# Patient Record
Sex: Male | Born: 1945 | Race: White | Hispanic: No | Marital: Married | Smoking: Former smoker
Health system: Southern US, Community
[De-identification: ages and names within clinical notes are randomized; demographics above are authoritative.]

## PROBLEM LIST (undated history)

## (undated) DIAGNOSIS — I219 Acute myocardial infarction, unspecified: Secondary | ICD-10-CM

## (undated) DIAGNOSIS — M1990 Unspecified osteoarthritis, unspecified site: Secondary | ICD-10-CM

## (undated) DIAGNOSIS — E7800 Pure hypercholesterolemia, unspecified: Secondary | ICD-10-CM

## (undated) DIAGNOSIS — I10 Essential (primary) hypertension: Secondary | ICD-10-CM

## (undated) DIAGNOSIS — K7682 Hepatic encephalopathy: Secondary | ICD-10-CM

## (undated) DIAGNOSIS — I2510 Atherosclerotic heart disease of native coronary artery without angina pectoris: Secondary | ICD-10-CM

## (undated) DIAGNOSIS — K7290 Hepatic failure, unspecified without coma: Secondary | ICD-10-CM

## (undated) DIAGNOSIS — K219 Gastro-esophageal reflux disease without esophagitis: Secondary | ICD-10-CM

## (undated) DIAGNOSIS — J449 Chronic obstructive pulmonary disease, unspecified: Secondary | ICD-10-CM

## (undated) DIAGNOSIS — Z5189 Encounter for other specified aftercare: Secondary | ICD-10-CM

## (undated) DIAGNOSIS — H269 Unspecified cataract: Secondary | ICD-10-CM

## (undated) DIAGNOSIS — K7460 Unspecified cirrhosis of liver: Secondary | ICD-10-CM

## (undated) DIAGNOSIS — K2270 Barrett's esophagus without dysplasia: Secondary | ICD-10-CM

## (undated) DIAGNOSIS — I8500 Esophageal varices without bleeding: Secondary | ICD-10-CM

## (undated) DIAGNOSIS — C801 Malignant (primary) neoplasm, unspecified: Secondary | ICD-10-CM

## (undated) HISTORY — PX: OTHER SURGICAL HISTORY: SHX169

## (undated) HISTORY — DX: Barrett's esophagus without dysplasia: K22.70

## (undated) HISTORY — DX: Hepatic failure, unspecified without coma: K72.90

## (undated) HISTORY — DX: Hepatic encephalopathy: K76.82

## (undated) HISTORY — DX: Pure hypercholesterolemia, unspecified: E78.00

## (undated) HISTORY — DX: Unspecified osteoarthritis, unspecified site: M19.90

## (undated) HISTORY — DX: Unspecified cataract: H26.9

## (undated) HISTORY — PX: CATARACT EXTRACTION: SUR2

## (undated) HISTORY — DX: Atherosclerotic heart disease of native coronary artery without angina pectoris: I25.10

## (undated) HISTORY — DX: Acute myocardial infarction, unspecified: I21.9

## (undated) HISTORY — DX: Malignant (primary) neoplasm, unspecified: C80.1

## (undated) HISTORY — DX: Chronic obstructive pulmonary disease, unspecified: J44.9

## (undated) HISTORY — DX: Essential (primary) hypertension: I10

## (undated) HISTORY — DX: Encounter for other specified aftercare: Z51.89

## (undated) HISTORY — DX: Gastro-esophageal reflux disease without esophagitis: K21.9

## (undated) HISTORY — DX: Esophageal varices without bleeding: I85.00

## (undated) HISTORY — DX: Unspecified cirrhosis of liver: K74.60

---

## 1998-06-18 DIAGNOSIS — I219 Acute myocardial infarction, unspecified: Secondary | ICD-10-CM

## 1998-06-18 HISTORY — PX: OTHER SURGICAL HISTORY: SHX169

## 1998-06-18 HISTORY — DX: Acute myocardial infarction, unspecified: I21.9

## 1998-09-25 ENCOUNTER — Inpatient Hospital Stay (HOSPITAL_COMMUNITY): Admission: AD | Admit: 1998-09-25 | Discharge: 1998-09-30 | Payer: Self-pay | Admitting: Internal Medicine

## 2002-05-07 HISTORY — PX: COLONOSCOPY: SHX174

## 2012-04-01 DIAGNOSIS — K7030 Alcoholic cirrhosis of liver without ascites: Secondary | ICD-10-CM | POA: Insufficient documentation

## 2012-04-01 DIAGNOSIS — I8501 Esophageal varices with bleeding: Secondary | ICD-10-CM | POA: Insufficient documentation

## 2012-04-01 DIAGNOSIS — K766 Portal hypertension: Secondary | ICD-10-CM | POA: Insufficient documentation

## 2012-04-01 DIAGNOSIS — E663 Overweight: Secondary | ICD-10-CM | POA: Insufficient documentation

## 2013-02-26 HISTORY — PX: ESOPHAGOGASTRODUODENOSCOPY: SHX1529

## 2017-04-22 DIAGNOSIS — Z0000 Encounter for general adult medical examination without abnormal findings: Secondary | ICD-10-CM | POA: Diagnosis not present

## 2017-04-22 DIAGNOSIS — E119 Type 2 diabetes mellitus without complications: Secondary | ICD-10-CM | POA: Diagnosis not present

## 2017-04-22 DIAGNOSIS — Z23 Encounter for immunization: Secondary | ICD-10-CM | POA: Diagnosis not present

## 2017-04-22 DIAGNOSIS — I10 Essential (primary) hypertension: Secondary | ICD-10-CM | POA: Diagnosis not present

## 2017-04-22 DIAGNOSIS — Z79899 Other long term (current) drug therapy: Secondary | ICD-10-CM | POA: Diagnosis not present

## 2017-04-22 DIAGNOSIS — G4700 Insomnia, unspecified: Secondary | ICD-10-CM | POA: Diagnosis not present

## 2017-05-01 DIAGNOSIS — M5416 Radiculopathy, lumbar region: Secondary | ICD-10-CM | POA: Diagnosis not present

## 2017-05-15 DIAGNOSIS — M5416 Radiculopathy, lumbar region: Secondary | ICD-10-CM | POA: Diagnosis not present

## 2017-05-15 DIAGNOSIS — M5136 Other intervertebral disc degeneration, lumbar region: Secondary | ICD-10-CM | POA: Diagnosis not present

## 2017-05-15 DIAGNOSIS — M48061 Spinal stenosis, lumbar region without neurogenic claudication: Secondary | ICD-10-CM | POA: Diagnosis not present

## 2017-06-24 DIAGNOSIS — Z9181 History of falling: Secondary | ICD-10-CM | POA: Diagnosis not present

## 2017-06-24 DIAGNOSIS — E785 Hyperlipidemia, unspecified: Secondary | ICD-10-CM | POA: Diagnosis not present

## 2017-06-24 DIAGNOSIS — Z125 Encounter for screening for malignant neoplasm of prostate: Secondary | ICD-10-CM | POA: Diagnosis not present

## 2017-06-24 DIAGNOSIS — Z0000 Encounter for general adult medical examination without abnormal findings: Secondary | ICD-10-CM | POA: Diagnosis not present

## 2017-06-24 DIAGNOSIS — Z1331 Encounter for screening for depression: Secondary | ICD-10-CM | POA: Diagnosis not present

## 2017-07-03 DIAGNOSIS — M5416 Radiculopathy, lumbar region: Secondary | ICD-10-CM | POA: Diagnosis not present

## 2017-07-17 DIAGNOSIS — G4700 Insomnia, unspecified: Secondary | ICD-10-CM | POA: Diagnosis not present

## 2017-07-25 DIAGNOSIS — Z125 Encounter for screening for malignant neoplasm of prostate: Secondary | ICD-10-CM | POA: Diagnosis not present

## 2017-07-25 DIAGNOSIS — G4700 Insomnia, unspecified: Secondary | ICD-10-CM | POA: Diagnosis not present

## 2017-07-25 DIAGNOSIS — Z139 Encounter for screening, unspecified: Secondary | ICD-10-CM | POA: Diagnosis not present

## 2017-08-07 DIAGNOSIS — M5136 Other intervertebral disc degeneration, lumbar region: Secondary | ICD-10-CM | POA: Diagnosis not present

## 2017-08-07 DIAGNOSIS — G4700 Insomnia, unspecified: Secondary | ICD-10-CM | POA: Diagnosis not present

## 2017-08-14 DIAGNOSIS — G4700 Insomnia, unspecified: Secondary | ICD-10-CM | POA: Diagnosis not present

## 2017-08-14 DIAGNOSIS — K7460 Unspecified cirrhosis of liver: Secondary | ICD-10-CM | POA: Diagnosis not present

## 2017-08-14 DIAGNOSIS — I2510 Atherosclerotic heart disease of native coronary artery without angina pectoris: Secondary | ICD-10-CM | POA: Diagnosis not present

## 2017-08-14 DIAGNOSIS — J449 Chronic obstructive pulmonary disease, unspecified: Secondary | ICD-10-CM | POA: Diagnosis not present

## 2017-08-29 ENCOUNTER — Encounter: Payer: Self-pay | Admitting: Gastroenterology

## 2017-08-30 DIAGNOSIS — I700 Atherosclerosis of aorta: Secondary | ICD-10-CM | POA: Diagnosis not present

## 2017-08-30 DIAGNOSIS — K7460 Unspecified cirrhosis of liver: Secondary | ICD-10-CM | POA: Diagnosis not present

## 2017-08-30 DIAGNOSIS — N135 Crossing vessel and stricture of ureter without hydronephrosis: Secondary | ICD-10-CM | POA: Diagnosis not present

## 2017-08-30 DIAGNOSIS — K8020 Calculus of gallbladder without cholecystitis without obstruction: Secondary | ICD-10-CM | POA: Diagnosis not present

## 2017-08-30 DIAGNOSIS — K7469 Other cirrhosis of liver: Secondary | ICD-10-CM | POA: Diagnosis not present

## 2017-08-30 DIAGNOSIS — K766 Portal hypertension: Secondary | ICD-10-CM | POA: Diagnosis not present

## 2017-09-03 DIAGNOSIS — G4700 Insomnia, unspecified: Secondary | ICD-10-CM | POA: Diagnosis not present

## 2017-09-03 DIAGNOSIS — G8929 Other chronic pain: Secondary | ICD-10-CM | POA: Diagnosis not present

## 2017-09-03 DIAGNOSIS — J209 Acute bronchitis, unspecified: Secondary | ICD-10-CM | POA: Diagnosis not present

## 2017-10-11 ENCOUNTER — Other Ambulatory Visit (INDEPENDENT_AMBULATORY_CARE_PROVIDER_SITE_OTHER): Payer: Medicare Other

## 2017-10-11 ENCOUNTER — Encounter: Payer: Self-pay | Admitting: Gastroenterology

## 2017-10-11 ENCOUNTER — Ambulatory Visit (INDEPENDENT_AMBULATORY_CARE_PROVIDER_SITE_OTHER): Payer: Medicare Other | Admitting: Gastroenterology

## 2017-10-11 VITALS — BP 118/62 | HR 60 | Ht 64.0 in | Wt 160.0 lb

## 2017-10-11 DIAGNOSIS — K7290 Hepatic failure, unspecified without coma: Secondary | ICD-10-CM | POA: Diagnosis not present

## 2017-10-11 DIAGNOSIS — I701 Atherosclerosis of renal artery: Secondary | ICD-10-CM

## 2017-10-11 DIAGNOSIS — K7031 Alcoholic cirrhosis of liver with ascites: Secondary | ICD-10-CM

## 2017-10-11 DIAGNOSIS — M545 Low back pain, unspecified: Secondary | ICD-10-CM | POA: Insufficient documentation

## 2017-10-11 DIAGNOSIS — K7682 Hepatic encephalopathy: Secondary | ICD-10-CM

## 2017-10-11 LAB — COMPREHENSIVE METABOLIC PANEL
ALBUMIN: 3.7 g/dL (ref 3.5–5.2)
ALK PHOS: 62 U/L (ref 39–117)
ALT: 21 U/L (ref 0–53)
AST: 26 U/L (ref 0–37)
BILIRUBIN TOTAL: 0.7 mg/dL (ref 0.2–1.2)
BUN: 18 mg/dL (ref 6–23)
CALCIUM: 9.2 mg/dL (ref 8.4–10.5)
CO2: 28 mEq/L (ref 19–32)
CREATININE: 0.99 mg/dL (ref 0.40–1.50)
Chloride: 102 mEq/L (ref 96–112)
GFR: 78.94 mL/min (ref >60.00)
Glucose, Bld: 107 mg/dL — ABNORMAL HIGH (ref 70–99)
Potassium: 4.2 mEq/L (ref 3.5–5.1)
Sodium: 137 mEq/L (ref 135–145)
TOTAL PROTEIN: 6.6 g/dL (ref 6.0–8.3)

## 2017-10-11 LAB — CBC WITH DIFFERENTIAL/PLATELET
BASOS ABS: 0 10*3/uL (ref 0.0–0.1)
Basophils Relative: 0.6 % (ref 0.0–3.0)
EOS ABS: 0 10*3/uL (ref 0.0–0.7)
Eosinophils Relative: 1.9 % (ref 0.0–5.0)
HEMATOCRIT: 37.6 % — AB (ref 39.0–52.0)
HEMOGLOBIN: 12.6 g/dL — AB (ref 13.0–17.0)
LYMPHS PCT: 45.8 % (ref 12.0–46.0)
Lymphs Abs: 0.9 10*3/uL (ref 0.7–4.0)
MCHC: 33.5 g/dL (ref 30.0–36.0)
MCV: 97.9 fl (ref 78.0–100.0)
MONO ABS: 0.2 10*3/uL (ref 0.1–1.0)
Monocytes Relative: 11.9 % (ref 3.0–12.0)
Neutro Abs: 0.8 10*3/uL — ABNORMAL LOW (ref 1.4–7.7)
Neutrophils Relative %: 39.8 % — ABNORMAL LOW (ref 43.0–77.0)
Platelets: 97 10*3/uL — ABNORMAL LOW (ref 150.0–400.0)
RBC: 3.84 Mil/uL — AB (ref 4.22–5.81)
RDW: 15.7 % — ABNORMAL HIGH (ref 11.5–15.5)

## 2017-10-11 LAB — PROTIME-INR
INR: 1.2 ratio — ABNORMAL HIGH (ref 0.8–1.0)
Prothrombin Time: 13.5 s — ABNORMAL HIGH (ref 9.6–13.1)

## 2017-10-11 LAB — AMMONIA: Ammonia: 49 umol/L — ABNORMAL HIGH (ref 11–35)

## 2017-10-11 MED ORDER — RIFAXIMIN 550 MG PO TABS: 550.0000 mg | ORAL_TABLET | Freq: Two times a day (BID) | ORAL | 3 refills | Status: DC

## 2017-10-11 MED ORDER — CARVEDILOL 12.5 MG PO TABS: 12.5000 mg | ORAL_TABLET | Freq: Two times a day (BID) | ORAL | 3 refills | Status: DC

## 2017-10-11 MED ORDER — POTASSIUM CHLORIDE ER 20 MEQ PO TBCR: 20.0000 meq | EXTENDED_RELEASE_TABLET | Freq: Every day | ORAL | 3 refills | Status: DC

## 2017-10-11 MED ORDER — FUROSEMIDE 40 MG PO TABS: 40.0000 mg | ORAL_TABLET | Freq: Every day | ORAL | 3 refills | Status: DC

## 2017-10-11 NOTE — Patient Instructions (Addendum)
If you are age 72 or older, your body mass index should be between 23-30. Your Body mass index is 27.46 kg/m. If this is out of the aforementioned range listed, please consider follow up with your Primary Care Provider.  If you are age 54 or younger, your body mass index should be between 19-25. Your Body mass index is 27.46 kg/m. If this is out of the aformentioned range listed, please consider follow up with your Primary Care Provider.   We have sent the following medications to your pharmacy for you to pick up at your convenience: Josie Saunders twice daily Carvedilol twice daily Lasix once daily.  K Dur once daily.  Please stop at the lab before you leave the office today.   Thank you,  Dr. Jackquline Denmark

## 2017-10-11 NOTE — Progress Notes (Signed)
IMPRESSION and PLAN:    #1. Alcoholic Liver Cirrhosis with portal hypertension (off ETOH since april 2006). Liver Bx 01/2005-grade 3, stage IV cirrhosis.  Complicated by portal hypertension (splenomegaly, thrombocytopenia, esophageal varices with history of bleeding status post EVL, ascites and hepatic encephalopathy). MELD-Na: 11 (12/2016)  -Continue Lasix 40mg  po qd. -KDur 20 mEq p.o. once a day to continue. - Add Rifaxamin 500mg  po bid (samples given and prescription called in). Had to stop previously due to cost.  Hopefully, it will be covered.  He wants to stop lactulose. I have urged him to continue at a much lower dose.  -Please obtain previous records - CT from [facility] March 2019.  -Check CBC, CMP, ammonia, PT/INR today. -Continue salt and fluid restricted diet. -FU in 6 months.  #2.  Esophageal varices status post EVL 2007, Most recent EGD 01/31/2012-small esophageal varices, too small for EVL, fundal varices, small hiatal hernia, mild portal hypertensive gastropathy.  -Continue Carvedilol 12.5mg  po bid. -I have offered patient EGD today.  He would like to hold off. -FU in 6 months, earlier in case of any problems.  #3.  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 Loletha Grayer CT was done for [facility] screening).  Patient does not want to pursue any further work-up/treatment.    #4. [place] screening. Neg CT 01/07/2017, AFP 2.0 (12/2016).  -Check AFP today. -Obtain results of the CT scan performed at [facility].  HPI:    Chief Complaint:   Patient is a 72 year old very pleasant white male with advanced alcoholic liver cirrhosis with portal hypertension being followed by Dr. Loletha Grayer at [facility]. Next appt with Dr Loletha Grayer in July 2019.  Had CT scan done in [facility] recently at request of Dr. Loletha Grayer.  I do not have any records currently.  Comes to the GI clinic for follow-up visit and prescription refill.  Has no  complaints.  No nausea, vomiting, heartburn, regurgitation, odynophagia or dysphagia.  No significant diarrhea or constipation.  There is no melena or hematochezia. No unintentional weight loss.  No abdominal pain.    Review of systems:       Past Medical History:  Diagnosis Date  . Barrett's esophagus   . CAD (coronary artery disease)   . Cirrhosis ([place])   . COPD (chronic obstructive pulmonary disease) ([place])   . Esophageal varices ([place])   . Hepatic encephalopathy ([place])   . Hypercholesteremia   . Hypertension     Current Outpatient Medications  Medication Sig Dispense Refill  . enalapril (VASOTEC) 20 MG tablet Take by mouth.    . esomeprazole (NEXIUM) 40 MG capsule daily.    . furosemide (LASIX) 40 MG tablet Take by mouth.    . lactulose (CHRONULAC) 10 GM/15ML solution Take by mouth.    . traMADol (ULTRAM) 50 MG tablet Take by mouth.    . carvedilol (COREG) 12.5 MG tablet Take by mouth.    . Multiple Vitamin (MULTIVITAMIN) capsule Take by mouth.    . temazepam (RESTORIL) 30 MG capsule      No current facility-administered medications for this visit.     Family History  Problem Relation Age of Onset  . Colon cancer Neg Hx     Social History   Tobacco Use  . Smoking status: Former Research scientist (life sciences)  . Smokeless tobacco: Former Network engineer Use Topics  . Alcohol use: Not Currently  . Drug use: Not on file    Allergies  Allergen Reactions  . Codeine      Review of Systems: All systems reviewed and negative except where noted in HPI.    Physical Exam:     BP 118/62   Pulse 60   Ht 5\' 4"  (1.626 m)   Wt 160 lb (72.6 kg)   BMI 27.46 kg/m  @WEIGHTLAST3 @ GENERAL:  Alert, oriented, cooperative, not in acute distress. PSYCH: :Pleasant, normal mood and affect. HEENT:  conjunctiva pink, mucous membranes moist, neck supple without masses. No jaundice. CARDIAC:  S1 S2 normal. No murmers. PULM: Normal respiratory effort, lungs CTA bilaterally, no wheezing. ABDOMEN:  Inspection: No visible peristalsis, no abnormal pulsations, skin normal.  Palpation/percussion: Soft, nontender, nondistended, no rigidity, no abnormal dullness to percussion, no hepatosplenomegaly and no palpable abdominal masses.  Auscultation: Normal bowel sounds, no abdominal bruits. Rectal exam: Deferred SKIN:  turgor, no lesions seen. Musculoskeletal:  Normal muscle tone, normal strength. NEURO: Alert and oriented x 3, no focal neurologic deficits.     Britny Riel,MD 10/11/2017, 11:28 AM   CC Dr Delena Bali

## 2017-10-14 ENCOUNTER — Other Ambulatory Visit: Payer: Self-pay

## 2017-10-14 ENCOUNTER — Telehealth: Payer: Self-pay

## 2017-10-14 DIAGNOSIS — K7031 Alcoholic cirrhosis of liver with ascites: Secondary | ICD-10-CM

## 2017-10-14 NOTE — Addendum Note (Signed)
Addended by: Karena Addison on: 10/14/2017 08:49 AM   Modules accepted: Orders

## 2017-10-14 NOTE — Telephone Encounter (Signed)
Should be fine. Please send a note to PCP to get it done - repeat ammonia and alpha-fetoprotein

## 2017-10-14 NOTE — Telephone Encounter (Signed)
Lab was not able to add on the AFP, not enough blood.  He is seeing his PCP in 2 weeks and wants to have the ammonia level and AFP done at that visit.  Is that timeframe OK?  Please advise.  Thank you.

## 2017-10-15 ENCOUNTER — Telehealth: Payer: Self-pay | Admitting: Gastroenterology

## 2017-10-15 NOTE — Telephone Encounter (Signed)
He is complaining that the Xifaxan makes him "dizzy" and is asking to switch to a different medication.  Please advise.  Thank you.

## 2017-10-15 NOTE — Telephone Encounter (Signed)
His ammonia level was elevated.  Please tell him to continue taking lactulose And titrate It to 2-3 bowel movements per day. No other alternative to rifaximin.  He should get better if he continues to take it.

## 2017-10-16 ENCOUNTER — Telehealth: Payer: Self-pay

## 2017-10-16 ENCOUNTER — Other Ambulatory Visit: Payer: Self-pay

## 2017-10-16 DIAGNOSIS — K7031 Alcoholic cirrhosis of liver with ascites: Secondary | ICD-10-CM

## 2017-10-16 DIAGNOSIS — K7290 Hepatic failure, unspecified without coma: Secondary | ICD-10-CM

## 2017-10-16 DIAGNOSIS — K7682 Hepatic encephalopathy: Secondary | ICD-10-CM

## 2017-10-16 MED ORDER — LACTULOSE 10 GM/15ML PO SOLN: ORAL | 1 refills | Status: DC

## 2017-10-16 NOTE — Telephone Encounter (Signed)
He reports that he is almost out of the Lactulose so I did send a refill to his pharmacy.

## 2017-10-16 NOTE — Telephone Encounter (Signed)
lmtcb 5/1   PKW

## 2017-10-21 ENCOUNTER — Telehealth: Payer: Self-pay | Admitting: Gastroenterology

## 2017-10-29 DIAGNOSIS — G4700 Insomnia, unspecified: Secondary | ICD-10-CM | POA: Diagnosis not present

## 2017-10-29 DIAGNOSIS — I10 Essential (primary) hypertension: Secondary | ICD-10-CM | POA: Diagnosis not present

## 2017-10-29 DIAGNOSIS — Z79899 Other long term (current) drug therapy: Secondary | ICD-10-CM | POA: Diagnosis not present

## 2017-10-29 DIAGNOSIS — I2510 Atherosclerotic heart disease of native coronary artery without angina pectoris: Secondary | ICD-10-CM | POA: Diagnosis not present

## 2017-12-20 DIAGNOSIS — H04123 Dry eye syndrome of bilateral lacrimal glands: Secondary | ICD-10-CM | POA: Diagnosis not present

## 2017-12-20 DIAGNOSIS — H11821 Conjunctivochalasis, right eye: Secondary | ICD-10-CM | POA: Diagnosis not present

## 2017-12-23 ENCOUNTER — Telehealth: Payer: Self-pay | Admitting: Gastroenterology

## 2017-12-23 DIAGNOSIS — K7031 Alcoholic cirrhosis of liver with ascites: Secondary | ICD-10-CM

## 2017-12-23 DIAGNOSIS — K7290 Hepatic failure, unspecified without coma: Secondary | ICD-10-CM

## 2017-12-23 DIAGNOSIS — K7682 Hepatic encephalopathy: Secondary | ICD-10-CM

## 2017-12-24 NOTE — Telephone Encounter (Signed)
Pt returned your call and would like another call back. °

## 2017-12-24 NOTE — Telephone Encounter (Signed)
Left message for patient to return phone call.  

## 2017-12-25 MED ORDER — LACTULOSE 10 GM/15ML PO SOLN: ORAL | 5 refills | Status: DC

## 2017-12-25 NOTE — Telephone Encounter (Signed)
Patient needed refill on Lactulose. Sent refills to patients pharmacy.

## 2017-12-31 DIAGNOSIS — E119 Type 2 diabetes mellitus without complications: Secondary | ICD-10-CM | POA: Diagnosis not present

## 2017-12-31 DIAGNOSIS — Z79899 Other long term (current) drug therapy: Secondary | ICD-10-CM | POA: Diagnosis not present

## 2017-12-31 DIAGNOSIS — I10 Essential (primary) hypertension: Secondary | ICD-10-CM | POA: Diagnosis not present

## 2017-12-31 DIAGNOSIS — G4700 Insomnia, unspecified: Secondary | ICD-10-CM | POA: Diagnosis not present

## 2017-12-31 DIAGNOSIS — Z1339 Encounter for screening examination for other mental health and behavioral disorders: Secondary | ICD-10-CM | POA: Diagnosis not present

## 2018-01-17 DIAGNOSIS — H2513 Age-related nuclear cataract, bilateral: Secondary | ICD-10-CM | POA: Diagnosis not present

## 2018-01-17 DIAGNOSIS — H11821 Conjunctivochalasis, right eye: Secondary | ICD-10-CM | POA: Diagnosis not present

## 2018-01-17 DIAGNOSIS — H524 Presbyopia: Secondary | ICD-10-CM | POA: Diagnosis not present

## 2018-01-17 DIAGNOSIS — H52223 Regular astigmatism, bilateral: Secondary | ICD-10-CM | POA: Diagnosis not present

## 2018-01-17 DIAGNOSIS — H04123 Dry eye syndrome of bilateral lacrimal glands: Secondary | ICD-10-CM | POA: Diagnosis not present

## 2018-02-11 DIAGNOSIS — H6120 Impacted cerumen, unspecified ear: Secondary | ICD-10-CM | POA: Diagnosis not present

## 2018-02-20 ENCOUNTER — Telehealth: Payer: Self-pay | Admitting: Gastroenterology

## 2018-02-20 DIAGNOSIS — K7290 Hepatic failure, unspecified without coma: Secondary | ICD-10-CM

## 2018-02-20 DIAGNOSIS — K7031 Alcoholic cirrhosis of liver with ascites: Secondary | ICD-10-CM

## 2018-02-20 DIAGNOSIS — K7682 Hepatic encephalopathy: Secondary | ICD-10-CM

## 2018-02-20 MED ORDER — CARVEDILOL 12.5 MG PO TABS: 12.5000 mg | ORAL_TABLET | Freq: Two times a day (BID) | ORAL | 3 refills | Status: DC

## 2018-02-20 MED ORDER — LACTULOSE 10 GM/15ML PO SOLN: ORAL | 5 refills | Status: DC

## 2018-02-20 NOTE — Telephone Encounter (Signed)
Sent refills to patients pharmacy.  

## 2018-03-21 ENCOUNTER — Ambulatory Visit: Payer: Medicare Other | Admitting: Gastroenterology

## 2018-04-02 DIAGNOSIS — E119 Type 2 diabetes mellitus without complications: Secondary | ICD-10-CM | POA: Diagnosis not present

## 2018-04-02 DIAGNOSIS — G4700 Insomnia, unspecified: Secondary | ICD-10-CM | POA: Diagnosis not present

## 2018-04-02 DIAGNOSIS — E785 Hyperlipidemia, unspecified: Secondary | ICD-10-CM | POA: Diagnosis not present

## 2018-04-02 DIAGNOSIS — Z23 Encounter for immunization: Secondary | ICD-10-CM | POA: Diagnosis not present

## 2018-04-02 DIAGNOSIS — Z79899 Other long term (current) drug therapy: Secondary | ICD-10-CM | POA: Diagnosis not present

## 2018-05-29 ENCOUNTER — Ambulatory Visit: Payer: Medicare Other | Admitting: Gastroenterology

## 2018-06-05 ENCOUNTER — Ambulatory Visit: Payer: Medicare Other | Admitting: Gastroenterology

## 2018-06-05 ENCOUNTER — Encounter: Payer: Self-pay | Admitting: Gastroenterology

## 2018-06-05 ENCOUNTER — Other Ambulatory Visit (INDEPENDENT_AMBULATORY_CARE_PROVIDER_SITE_OTHER): Payer: Medicare Other

## 2018-06-05 DIAGNOSIS — K7031 Alcoholic cirrhosis of liver with ascites: Secondary | ICD-10-CM | POA: Diagnosis not present

## 2018-06-05 DIAGNOSIS — K7682 Hepatic encephalopathy: Secondary | ICD-10-CM

## 2018-06-05 DIAGNOSIS — K7290 Hepatic failure, unspecified without coma: Secondary | ICD-10-CM

## 2018-06-05 LAB — CBC WITH DIFFERENTIAL/PLATELET
Basophils Absolute: 0 10*3/uL (ref 0.0–0.1)
Basophils Relative: 0.7 % (ref 0.0–3.0)
Eosinophils Absolute: 0.1 10*3/uL (ref 0.0–0.7)
Eosinophils Relative: 2.7 % (ref 0.0–5.0)
HCT: 41.1 % (ref 39.0–52.0)
HEMOGLOBIN: 13.8 g/dL (ref 13.0–17.0)
Lymphocytes Relative: 46.3 % — ABNORMAL HIGH (ref 12.0–46.0)
Lymphs Abs: 1.1 10*3/uL (ref 0.7–4.0)
MCHC: 33.5 g/dL (ref 30.0–36.0)
MCV: 96.6 fl (ref 78.0–100.0)
Monocytes Absolute: 0.3 10*3/uL (ref 0.1–1.0)
Monocytes Relative: 12.7 % — ABNORMAL HIGH (ref 3.0–12.0)
Neutro Abs: 0.9 10*3/uL — ABNORMAL LOW (ref 1.4–7.7)
Neutrophils Relative %: 37.6 % — ABNORMAL LOW (ref 43.0–77.0)
Platelets: 104 10*3/uL — ABNORMAL LOW (ref 150.0–400.0)
RBC: 4.26 Mil/uL (ref 4.22–5.81)
RDW: 15.8 % — ABNORMAL HIGH (ref 11.5–15.5)
WBC: 2.5 10*3/uL — ABNORMAL LOW (ref 4.0–10.5)

## 2018-06-05 LAB — COMPREHENSIVE METABOLIC PANEL
ALT: 21 U/L (ref 0–53)
AST: 27 U/L (ref 0–37)
Albumin: 4.3 g/dL (ref 3.5–5.2)
Alkaline Phosphatase: 57 U/L (ref 39–117)
BUN: 20 mg/dL (ref 6–23)
CALCIUM: 10.1 mg/dL (ref 8.4–10.5)
CO2: 27 mEq/L (ref 19–32)
Chloride: 100 mEq/L (ref 96–112)
Creatinine, Ser: 1.14 mg/dL (ref 0.40–1.50)
GFR: 66.96 mL/min (ref >60.00)
Glucose, Bld: 112 mg/dL — ABNORMAL HIGH (ref 70–99)
Potassium: 4.3 mEq/L (ref 3.5–5.1)
Sodium: 137 mEq/L (ref 135–145)
Total Bilirubin: 0.9 mg/dL (ref 0.2–1.2)
Total Protein: 7.2 g/dL (ref 6.0–8.3)

## 2018-06-05 LAB — PROTIME-INR
INR: 1.1 ratio — ABNORMAL HIGH (ref 0.8–1.0)
Prothrombin Time: 12.9 s (ref 9.6–13.1)

## 2018-06-05 LAB — AMMONIA: Ammonia: 43 umol/L — ABNORMAL HIGH (ref 11–35)

## 2018-06-05 MED ORDER — FUROSEMIDE 40 MG PO TABS: 40.0000 mg | ORAL_TABLET | Freq: Every day | ORAL | 11 refills | Status: DC

## 2018-06-05 MED ORDER — LACTULOSE 10 GM/15ML PO SOLN: ORAL | 11 refills | Status: DC

## 2018-06-05 MED ORDER — RIFAXIMIN 550 MG PO TABS: 550.0000 mg | ORAL_TABLET | Freq: Two times a day (BID) | ORAL | 11 refills | Status: DC

## 2018-06-05 MED ORDER — POTASSIUM CHLORIDE ER 20 MEQ PO TBCR: 20.0000 meq | EXTENDED_RELEASE_TABLET | Freq: Every day | ORAL | 11 refills | Status: DC

## 2018-06-05 NOTE — Progress Notes (Signed)
IMPRESSION and PLAN:    #1. Alcoholic Liver Cirrhosis with portal hypertension (off ETOH since april 2006). Liver Bx 01/2005-grade 3, stage IV cirrhosis.  Complicated by portal hypertension (splenomegaly, thrombocytopenia/pancytopenia, esophageal varices with history of bleeding status post EVL, ascites and hepatic encephalopathy). MELD-Na: 11 (12/2016)  - Continue Lasix 40mg  po qd. - KDur 20 mEq p.o. once a day to continue. - Rifaxamin 500mg  po bid. Had to stop previously due to cost.  Hopefully, it will be covered.  - Resume lactulose 30 cc bid. Aim is to have 2-3 loose BMs a day. - Please obtain previous records - CT from [facility] March 2019.  - Check CBC, CMP, ammonia, PT/INR today. - Continue salt and fluid restricted diet. - FU in 6 months.  #2.  Esophageal varices status post EVL 2007, Most recent EGD 02/2013-small esophageal varices, too small for EVL, small fundal varices, small hiatal hernia, mild portal hypertensive gastropathy, short segment Barrett's esophagus..  -Continue Carvedilol 12.5mg  po bid. -Offered EGD but he refuses again. Has transportation issues. -Continue omeprazole 20 mg p.o. once a day. -FU in 6 months, earlier in case of any problems.  #3.  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 Loletha Grayer CT was done for [facility] screening).  Patient does not want to pursue any further work-up/treatment.    #4. [place] screening. Neg CT 01/07/2017, AFP 2.0 (12/2016).  -Check AFP today. -Obtain results of the CT scan performed at [place] to get. -Awaiting US liver with doppler at [facility] as per Dr Glade Stanford orders.  She recommends ultrasound twice a year.  HPI:    Chief Complaint:   Patient is a 72 year old very pleasant white male with advanced alcoholic liver cirrhosis with portal hypertension being followed by Dr. Loletha Grayer at [facility]. Saw Dr Loletha Grayer 04/21/2018.  No complaints.  Has not been taking  lactulose as prescribed.  He has also stopped taking rifaximin on his own.  Refuses EGD due to transportation problems.  No nausea, vomiting, heartburn, regurgitation, odynophagia or dysphagia.  No significant diarrhea or constipation.  There is no melena or hematochezia. No unintentional weight loss.  No abdominal pain.    Review of systems:       Past Medical History:  Diagnosis Date  . Barrett's esophagus   . CAD (coronary artery disease)   . Cirrhosis ([place])   . COPD (chronic obstructive pulmonary disease) ([place])   . Esophageal varices ([place])   . Heart attack ([place]) 2000  . Hepatic encephalopathy ([place])   . Hypercholesteremia   . Hypertension     Current Outpatient Medications  Medication Sig Dispense Refill  . carvedilol (COREG) 12.5 MG tablet Take 1 tablet (12.5 mg total) by mouth 2 (two) times daily. 60 tablet 3  . enalapril (VASOTEC) 20 MG tablet Take by mouth.    . furosemide (LASIX) 40 MG tablet Take 1 tablet (40 mg total) by mouth daily. 30 tablet 3  . lactulose (CHRONULAC) 10 GM/15ML solution Take 15 ml by mouth every other and PRN 240 mL 5  . Multiple Vitamin (MULTIVITAMIN) capsule Take by mouth.    . Omeprazole (PRILOSEC PO) Take 1 tablet by mouth daily as needed.    . Potassium Chloride ER 20 MEQ TBCR Take 20 mEq by mouth daily. 30 tablet 3  . temazepam (RESTORIL) 30 MG capsule     . traMADol (ULTRAM) 50 MG tablet Take by mouth.     No current facility-administered  medications for this visit.     Family History  Problem Relation Age of Onset  . Cancer Brother        mouth per patient   . Colon cancer Neg Hx   . Esophageal cancer Neg Hx     Social History   Tobacco Use  . Smoking status: Former Research scientist (life sciences)  . Smokeless tobacco: Former Systems developer  . Tobacco comment: quit smoking in 2000   Substance Use Topics  . Alcohol use: Not Currently  . Drug use: Not Currently    Allergies  Allergen Reactions  . Codeine      Review of Systems: All systems reviewed and  negative except where noted in HPI.    Physical Exam:     BP 108/62   Pulse 62   Ht 5\' 4"  (1.626 m)   Wt 148 lb (67.1 kg)   BMI 25.40 kg/m   GENERAL:  Alert, oriented, cooperative, not in acute distress. PSYCH: :Pleasant, normal mood and affect. HEENT:  conjunctiva pink, mucous membranes moist, neck supple without masses. No jaundice. CARDIAC:  S1 S2 normal. No murmers. PULM: Normal respiratory effort, lungs CTA bilaterally, no wheezing. ABDOMEN: Inspection: No visible peristalsis, no abnormal pulsations, skin normal.  Palpation/percussion: Soft, nontender, nondistended, no rigidity, no abnormal dullness to percussion, no hepatosplenomegaly and no palpable abdominal masses.  Auscultation: Normal bowel sounds, no abdominal bruits. Rectal exam: Deferred SKIN:  turgor, no lesions seen. Musculoskeletal:  Normal muscle tone, normal strength. NEURO: Alert and oriented x 3, no focal neurologic deficits. CBC Latest Ref Rng & Units 10/11/2017  WBC 4.0 - 10.5 K/uL 2.0 Repeated and verified X2.(L)  Hemoglobin 13.0 - 17.0 g/dL 12.6(L)  Hematocrit 39.0 - 52.0 % 37.6(L)  Platelets 150.0 - 400.0 K/uL 97.0(L)   CMP Latest Ref Rng & Units 10/11/2017  Glucose 70 - 99 mg/dL 107(H)  BUN 6 - 23 mg/dL 18  Creatinine 0.40 - 1.50 mg/dL 0.99  Sodium 135 - 145 mEq/L 137  Potassium 3.5 - 5.1 mEq/L 4.2  Chloride 96 - 112 mEq/L 102  CO2 19 - 32 mEq/L 28  Calcium 8.4 - 10.5 mg/dL 9.2  Total Protein 6.0 - 8.3 g/dL 6.6  Total Bilirubin 0.2 - 1.2 mg/dL 0.7  Alkaline Phos 39 - 117 U/L 62  AST 0 - 37 U/L 26  ALT 0 - 53 U/L 21  25 minutes spent with the patient today. Greater than 50% was spent in counseling and coordination of care with the patient      Christropher Gintz,MD 06/05/2018, 1:42 PM   CC Dr Delena Bali

## 2018-06-05 NOTE — Patient Instructions (Signed)
If you are age 72 or older, your body mass index should be between 23-30. Your Body mass index is 25.4 kg/m. If this is out of the aforementioned range listed, please consider follow up with your Primary Care Provider.  If you are age 7 or younger, your body mass index should be between 19-25. Your Body mass index is 25.4 kg/m. If this is out of the aformentioned range listed, please consider follow up with your Primary Care Provider.   We have sent the following medications to your pharmacy for you to pick up at your convenience: Rifaxamin  Lactulose  Lasix Potassium  Please go to the lab on the 2nd floor [address] before you leave the office today.   Thank you,  Dr. Jackquline Denmark

## 2018-06-06 LAB — AFP TUMOR MARKER: AFP-Tumor Marker: 2.1 ng/mL (ref <6.1)

## 2018-06-19 ENCOUNTER — Telehealth: Payer: Self-pay | Admitting: Gastroenterology

## 2018-06-19 DIAGNOSIS — K7682 Hepatic encephalopathy: Secondary | ICD-10-CM

## 2018-06-19 DIAGNOSIS — K7290 Hepatic failure, unspecified without coma: Secondary | ICD-10-CM

## 2018-06-19 DIAGNOSIS — K7031 Alcoholic cirrhosis of liver with ascites: Secondary | ICD-10-CM

## 2018-06-19 MED ORDER — LACTULOSE 10 GM/15ML PO SOLN: ORAL | 11 refills | Status: DC

## 2018-06-19 NOTE — Telephone Encounter (Signed)
Pt states that his pharmacy did not receive prescription for lactulose. He uses Environmental health practitioner.

## 2018-06-19 NOTE — Telephone Encounter (Signed)
Sen prescription to patients pharmacy.

## 2018-06-27 NOTE — Telephone Encounter (Signed)
Done

## 2018-07-02 DIAGNOSIS — G4700 Insomnia, unspecified: Secondary | ICD-10-CM | POA: Diagnosis not present

## 2018-07-02 DIAGNOSIS — E785 Hyperlipidemia, unspecified: Secondary | ICD-10-CM | POA: Diagnosis not present

## 2018-07-02 DIAGNOSIS — E119 Type 2 diabetes mellitus without complications: Secondary | ICD-10-CM | POA: Diagnosis not present

## 2018-07-02 DIAGNOSIS — I10 Essential (primary) hypertension: Secondary | ICD-10-CM | POA: Diagnosis not present

## 2018-07-07 DIAGNOSIS — J449 Chronic obstructive pulmonary disease, unspecified: Secondary | ICD-10-CM | POA: Diagnosis not present

## 2018-07-07 DIAGNOSIS — K7460 Unspecified cirrhosis of liver: Secondary | ICD-10-CM | POA: Diagnosis not present

## 2018-07-07 DIAGNOSIS — K8020 Calculus of gallbladder without cholecystitis without obstruction: Secondary | ICD-10-CM | POA: Diagnosis not present

## 2018-07-07 DIAGNOSIS — R05 Cough: Secondary | ICD-10-CM | POA: Diagnosis not present

## 2018-07-11 ENCOUNTER — Telehealth: Payer: Self-pay | Admitting: Gastroenterology

## 2018-07-11 MED ORDER — CARVEDILOL 12.5 MG PO TABS: 12.5000 mg | ORAL_TABLET | Freq: Two times a day (BID) | ORAL | 3 refills | Status: DC

## 2018-07-11 NOTE — Telephone Encounter (Signed)
Patient states he needs a refill of medication carvedilol sent to [facility].

## 2018-07-11 NOTE — Telephone Encounter (Signed)
Sent refills to patients pharmacy.  

## 2018-07-18 DIAGNOSIS — R1031 Right lower quadrant pain: Secondary | ICD-10-CM | POA: Diagnosis not present

## 2018-07-22 DIAGNOSIS — K4090 Unilateral inguinal hernia, without obstruction or gangrene, not specified as recurrent: Secondary | ICD-10-CM | POA: Diagnosis not present

## 2018-07-22 DIAGNOSIS — K5730 Diverticulosis of large intestine without perforation or abscess without bleeding: Secondary | ICD-10-CM | POA: Diagnosis not present

## 2018-07-22 DIAGNOSIS — R1031 Right lower quadrant pain: Secondary | ICD-10-CM | POA: Diagnosis not present

## 2018-08-01 DIAGNOSIS — K4090 Unilateral inguinal hernia, without obstruction or gangrene, not specified as recurrent: Secondary | ICD-10-CM | POA: Diagnosis not present

## 2018-08-17 HISTORY — PX: OTHER SURGICAL HISTORY: SHX169

## 2018-08-18 DIAGNOSIS — Z79899 Other long term (current) drug therapy: Secondary | ICD-10-CM | POA: Diagnosis not present

## 2018-08-18 DIAGNOSIS — I119 Hypertensive heart disease without heart failure: Secondary | ICD-10-CM | POA: Diagnosis not present

## 2018-08-18 DIAGNOSIS — I4510 Unspecified right bundle-branch block: Secondary | ICD-10-CM | POA: Diagnosis not present

## 2018-08-18 DIAGNOSIS — Z87891 Personal history of nicotine dependence: Secondary | ICD-10-CM | POA: Diagnosis not present

## 2018-08-18 DIAGNOSIS — E785 Hyperlipidemia, unspecified: Secondary | ICD-10-CM | POA: Diagnosis not present

## 2018-08-18 DIAGNOSIS — I10 Essential (primary) hypertension: Secondary | ICD-10-CM | POA: Diagnosis not present

## 2018-08-18 DIAGNOSIS — M1990 Unspecified osteoarthritis, unspecified site: Secondary | ICD-10-CM | POA: Diagnosis not present

## 2018-08-18 DIAGNOSIS — K219 Gastro-esophageal reflux disease without esophagitis: Secondary | ICD-10-CM | POA: Diagnosis not present

## 2018-08-18 DIAGNOSIS — G4700 Insomnia, unspecified: Secondary | ICD-10-CM | POA: Diagnosis not present

## 2018-08-18 DIAGNOSIS — I340 Nonrheumatic mitral (valve) insufficiency: Secondary | ICD-10-CM | POA: Diagnosis not present

## 2018-08-18 DIAGNOSIS — Z01810 Encounter for preprocedural cardiovascular examination: Secondary | ICD-10-CM | POA: Diagnosis not present

## 2018-08-18 DIAGNOSIS — E119 Type 2 diabetes mellitus without complications: Secondary | ICD-10-CM | POA: Diagnosis not present

## 2018-08-18 DIAGNOSIS — R9431 Abnormal electrocardiogram [ECG] [EKG]: Secondary | ICD-10-CM | POA: Diagnosis not present

## 2018-08-18 DIAGNOSIS — Z01818 Encounter for other preprocedural examination: Secondary | ICD-10-CM | POA: Diagnosis not present

## 2018-08-18 DIAGNOSIS — J449 Chronic obstructive pulmonary disease, unspecified: Secondary | ICD-10-CM | POA: Diagnosis not present

## 2018-08-18 DIAGNOSIS — K4090 Unilateral inguinal hernia, without obstruction or gangrene, not specified as recurrent: Secondary | ICD-10-CM | POA: Diagnosis not present

## 2018-08-29 DIAGNOSIS — Z09 Encounter for follow-up examination after completed treatment for conditions other than malignant neoplasm: Secondary | ICD-10-CM | POA: Diagnosis not present

## 2018-09-30 DIAGNOSIS — G8929 Other chronic pain: Secondary | ICD-10-CM | POA: Diagnosis not present

## 2018-09-30 DIAGNOSIS — G4700 Insomnia, unspecified: Secondary | ICD-10-CM | POA: Diagnosis not present

## 2018-09-30 DIAGNOSIS — I10 Essential (primary) hypertension: Secondary | ICD-10-CM | POA: Diagnosis not present

## 2018-09-30 DIAGNOSIS — E785 Hyperlipidemia, unspecified: Secondary | ICD-10-CM | POA: Diagnosis not present

## 2018-10-03 ENCOUNTER — Telehealth: Payer: Self-pay | Admitting: Cardiology

## 2018-10-03 NOTE — Telephone Encounter (Signed)
Virtual Visit Pre-Appointment Phone Call  Steps For Call:  1. Confirm consent - "In the setting of the current Covid19 crisis, you are scheduled for a (phone or video) visit with your provider on (date) at (time).  Just as we do with many in-office visits, in order for you to participate in this visit, we must obtain consent.  If you'd like, I can send this to your mychart (if signed up) or email for you to review.  Otherwise, I can obtain your verbal consent now.  All virtual visits are billed to your insurance company just like a normal visit would be.  By agreeing to a virtual visit, we'd like you to understand that the technology does not allow for your provider to perform an examination, and thus may limit your provider's ability to fully assess your condition. If your provider identifies any concerns that need to be evaluated in person, we will make arrangements to do so.  Finally, though the technology is pretty good, we cannot assure that it will always work on either your or our end, and in the setting of a video visit, we may have to convert it to a phone-only visit.  In either situation, we cannot ensure that we have a secure connection.  Are you willing to proceed?" STAFF: Did the patient verbally acknowledge consent to telehealth visit? Document YES/NO here: YES  2. Confirm the BEST phone number to call the day of the visit by including in appointment notes  3. Give patient instructions for WebEx/MyChart download to smartphone as below or Doximity/Doxy.me if video visit (depending on what platform provider is using)  4. Advise patient to be prepared with their blood pressure, heart rate, weight, any heart rhythm information, their current medicines, and a piece of paper and pen handy for any instructions they may receive the day of their visit  5. Inform patient they will receive a phone call 15 minutes prior to their appointment time (may be from unknown caller ID) so they should be  prepared to answer  6. Confirm that appointment type is correct in Epic appointment notes (VIDEO vs PHONE)     TELEPHONE CALL NOTE  Curtis Mullins has been deemed a candidate for a follow-up tele-health visit to limit community exposure during the Covid-19 pandemic. I spoke with the patient via phone to ensure availability of phone/video source, confirm preferred email & phone number, and discuss instructions and expectations.  I reminded Curtis Mullins to be prepared with any vital sign and/or heart rhythm information that could potentially be obtained via home monitoring, at the time of his visit. I reminded Curtis Mullins to expect a phone call at the time of his visit if his visit.  [name] 10/03/2018 2:58 PM   INSTRUCTIONS FOR DOWNLOADING THE [place] APP TO SMARTPHONE  - If Apple, ask patient to go to App Store and type in WebEx in the search bar. [place] [employer], the blue/green circle. If Android, go to [employer] and type in [employer] in the search bar. The app is free but as with any other app downloads, their phone may require them to verify saved payment information or Apple/Android password.  - The patient does NOT have to create an account. - On the day of the visit, the assist will walk the patient through joining the meeting with the meeting number/password.  INSTRUCTIONS FOR DOWNLOADING THE MYCHART APP TO SMARTPHONE  - The patient must first make  sure to have activated MyChart and know their login information - If Apple, go to [employer] and type in MyChart in the search bar and download the app. If Android, ask patient to go to [employer] and type in [place] in the search bar and download the app. The app is free but as with any other app downloads, their phone may require them to verify saved payment information or Apple/Android password.  - The patient will need to then log into the app with their MyChart username and password, and select [facility] as their healthcare provider to link the account. When it is time for your visit, go to the MyChart app, find appointments, and click Begin Video Visit. Be sure to Select Allow for your device to access the Microphone and Camera for your visit. You will then be connected, and your provider will be with you shortly.  **If they have any issues connecting, or need assistance please contact MyChart service desk [phone])**  **If using a computer, in order to ensure the best quality for their visit they will need to use either of the following Internet Browsers: [employer], or Google Chrome**  IF USING DOXIMITY or DOXY.ME - The patient will receive a link just prior to their visit, either by text or email (to be determined day of appointment depending on if it's doxy.me or Doximity).     FULL LENGTH CONSENT FOR TELE-HEALTH VISIT   I hereby voluntarily request, consent and authorize [place] and its employed or contracted physicians, physician assistants, nurse practitioners or other licensed health care professionals (the Practitioner), to provide me with telemedicine health care services (the Services") as deemed necessary by the treating Practitioner. I acknowledge and consent to receive the Services by the Practitioner via telemedicine. I understand that the telemedicine visit will involve communicating with the Practitioner through live audiovisual communication technology and the disclosure of certain medical information by electronic transmission. I acknowledge that I have been given the opportunity to request an in-person assessment or other available alternative prior to the telemedicine visit and am voluntarily participating in the telemedicine visit.  I understand that I have the right to withhold or withdraw my consent to the use of telemedicine in the course of my care at any time, without affecting my right to future care or treatment, and that the  Practitioner or I may terminate the telemedicine visit at any time. I understand that I have the right to inspect all information obtained and/or recorded in the course of the telemedicine visit and may receive copies of available information for a reasonable fee.  I understand that some of the potential risks of receiving the Services via telemedicine include:   Delay or interruption in medical evaluation due to technological equipment failure or disruption;  Information transmitted may not be sufficient (e.g. poor resolution of images) to allow for appropriate medical decision making by the Practitioner; and/or   In rare instances, security protocols could fail, causing a breach of personal health information.  Furthermore, I acknowledge that it is my responsibility to provide information about my medical history, conditions and care that is complete and accurate to the best of my ability. I acknowledge that Practitioner's advice, recommendations, and/or decision may be based on factors not within their control, such as incomplete or inaccurate data provided by me or distortions of diagnostic images or specimens that may result from electronic transmissions. I understand that the practice of medicine is not an exact  science and that Practitioner makes no warranties or guarantees regarding treatment outcomes. I acknowledge that I will receive a copy of this consent concurrently upon execution via email to the email address I last provided but may also request a printed copy by calling the office of [place].    I understand that my insurance will be billed for this visit.   I have read or had this consent read to me.  I understand the contents of this consent, which adequately explains the benefits and risks of the Services being provided via telemedicine.   I have been provided ample opportunity to ask questions regarding this consent and the Services and have had my questions answered to my  satisfaction.  I give my informed consent for the services to be provided through the use of telemedicine in my medical care  By participating in this telemedicine visit I agree to the above.

## 2018-10-06 ENCOUNTER — Telehealth (INDEPENDENT_AMBULATORY_CARE_PROVIDER_SITE_OTHER): Payer: Medicare Other | Admitting: Cardiology

## 2018-10-06 ENCOUNTER — Other Ambulatory Visit: Payer: Self-pay

## 2018-10-06 ENCOUNTER — Encounter: Payer: Self-pay | Admitting: Cardiology

## 2018-10-06 VITALS — BP 118/67 | HR 73 | Ht 64.0 in | Wt 140.4 lb

## 2018-10-06 DIAGNOSIS — I2510 Atherosclerotic heart disease of native coronary artery without angina pectoris: Secondary | ICD-10-CM | POA: Diagnosis not present

## 2018-10-06 DIAGNOSIS — K7030 Alcoholic cirrhosis of liver without ascites: Secondary | ICD-10-CM

## 2018-10-06 DIAGNOSIS — K766 Portal hypertension: Secondary | ICD-10-CM

## 2018-10-06 NOTE — Addendum Note (Signed)
Addended by: Beckey Rutter on: 10/06/2018 05:04 PM   Modules accepted: Orders

## 2018-10-06 NOTE — Patient Instructions (Signed)
Medication Instructions:  Your physician recommends that you continue on your current medications as directed. Please refer to the Current Medication list given to you today.  If you need a refill on your cardiac medications before your next appointment, please call your pharmacy.   Lab work: NONE If you have labs (blood work) drawn today and your tests are completely normal, you will receive your results only by: . MyChart Message (if you have MyChart) OR . A paper copy in the mail If you have any lab test that is abnormal or we need to change your treatment, we will call you to review the results.  Testing/Procedures: Your physician has requested that you have an echocardiogram. Echocardiography is a painless test that uses sound waves to create images of your heart. It provides your doctor with information about the size and shape of your heart and how well your heart's chambers and valves are working. This procedure takes approximately one hour. There are no restrictions for this procedure.   Follow-Up: At [facility], you and your health needs are our priority.  As part of our continuing mission to provide you with exceptional heart care, we have created designated Provider Care Teams.  These Care Teams include your primary Cardiologist (physician) and Advanced Practice Providers (APPs -  Physician Assistants and Nurse Practitioners) who all work together to provide you with the care you need, when you need it. You will need a follow up appointment in 6 months.    Any Other Special Instructions Will Be Listed Below    Echocardiogram An echocardiogram is a procedure that uses painless sound waves (ultrasound) to produce an image of the heart. Images from an echocardiogram can provide important information about:  Signs of coronary artery disease (CAD).  Aneurysm detection. An aneurysm is a weak or damaged part of an artery wall that bulges out from the normal force of blood pumping  through the body.  Heart size and shape. Changes in the size or shape of the heart can be associated with certain conditions, including heart failure, aneurysm, and CAD.  Heart muscle function.  Heart valve function.  Signs of a past heart attack.  Fluid buildup around the heart.  Thickening of the heart muscle.  A tumor or infectious growth around the heart valves. Tell a health care provider about:  Any allergies you have.  All medicines you are taking, including vitamins, herbs, eye drops, creams, and over-the-counter medicines.  Any blood disorders you have.  Any surgeries you have had.  Any medical conditions you have.  Whether you are pregnant or may be pregnant. What are the risks? Generally, this is a safe procedure. However, problems may occur, including:  Allergic reaction to dye (contrast) that may be used during the procedure. What happens before the procedure? No specific preparation is needed. You may eat and drink normally. What happens during the procedure?   An IV tube may be inserted into one of your veins.  You may receive contrast through this tube. A contrast is an injection that improves the quality of the pictures from your heart.  A gel will be applied to your chest.  A wand-like tool (transducer) will be moved over your chest. The gel will help to transmit the sound waves from the transducer.  The sound waves will harmlessly bounce off of your heart to allow the heart images to be captured in real-time motion. The images will be recorded on a computer. The procedure may vary among   health care providers and hospitals. What happens after the procedure?  You may return to your normal, everyday life, including diet, activities, and medicines, unless your health care provider tells you not to do that. Summary  An echocardiogram is a procedure that uses painless sound waves (ultrasound) to produce an image of the heart.  Images from an  echocardiogram can provide important information about the size and shape of your heart, heart muscle function, heart valve function, and fluid buildup around your heart.  You do not need to do anything to prepare before this procedure. You may eat and drink normally.  After the echocardiogram is completed, you may return to your normal, everyday life, unless your health care provider tells you not to do that. This information is not intended to replace advice given to you by your health care provider. Make sure you discuss any questions you have with your health care provider. Document Released: 06/01/2000 Document Revised: 07/07/2016 Document Reviewed: 07/07/2016 Elsevier Interactive Patient Education  2019 Elsevier Inc.  

## 2018-10-06 NOTE — Progress Notes (Signed)
Virtual Visit via Telephone Note   This visit type was conducted due to national recommendations for restrictions regarding the COVID-19 Pandemic (e.g. social distancing) in an effort to limit this patient's exposure and mitigate transmission in our community.  Due to his co-morbid illnesses, this patient is at least at moderate risk for complications without adequate follow up.  This format is felt to be most appropriate for this patient at this time.  The patient did not have access to video technology/had technical difficulties with video requiring transitioning to audio format only (telephone).  All issues noted in this document were discussed and addressed.  No physical exam could be performed with this format.  Please refer to the patient's chart for his  consent to telehealth for [facility].   Evaluation Performed:  Follow-up visit  Date:  10/06/2018   ID:  Curtis Mullins, DOB 09/27/1945, MRN [id number]  Patient Location: Home Provider Location: Home  PCP:  Earlyne Iba, NP  Cardiologist:  No primary care provider on file.  Electrophysiologist:  None   Chief Complaint: Coronary artery disease to be established  History of Present Illness:    Curtis Mullins is a 73 y.o. male with coronary artery disease, coronary stenting in the past, history of alcoholic cirrhosis.  He has history of portal hypertension and esophageal varices for which reason he is told not to take antiplatelet medication.  The patient mentioned to his primary care physician that his blood pressure is low for which reason he cut down his carvedilol.  He denies any chest pain orthopnea or PND.  His wife mentions to me that he is taking a keto diet and has lost significant amount of weight.  He is on a diuretic.  At the time of my evaluation, the patient is alert awake oriented and in no distress.  The patient does not have symptoms concerning for COVID-19 infection (fever, chills, cough, or new shortness of  breath).    Past Medical History:  Diagnosis Date  . Barrett's esophagus   . CAD (coronary artery disease)   . Cirrhosis ([place])   . COPD (chronic obstructive pulmonary disease) ([place])   . Esophageal varices ([place])   . Heart attack ([place]) 2000  . Hepatic encephalopathy ([place])   . Hypercholesteremia   . Hypertension    Past Surgical History:  Procedure Laterality Date  . cad s/p angioplasty  2000   stent placement   . CATARACT EXTRACTION    . COLONOSCOPY  05/07/2002   Diminutive colonic polyp status post polypectomy. Mild diverticulosis. Internal hemorrhoids  . ESOPHAGOGASTRODUODENOSCOPY  02/26/2013   Small esophageal varices (2 channels). Too small for endoscopic variceal ligation. Small fundal varices. Small hiatal hernia. Mild portal hypertensive gastropathy   . left ing Hernia Repair       Current Meds  Medication Sig  . carvedilol (COREG) 12.5 MG tablet Take 1 tablet (12.5 mg total) by mouth 2 (two) times daily. (Patient taking differently: Take 6.25 mg by mouth 2 (two) times daily. )  . enalapril (VASOTEC) 5 MG tablet Take 5 mg by mouth 2 (two) times daily.   . furosemide (LASIX) 40 MG tablet Take 1 tablet (40 mg total) by mouth daily.  Marland Kitchen lactulose (CHRONULAC) 10 GM/15ML solution Take 30 ml by mouth every other and PRN  . Multiple Vitamin (MULTIVITAMIN) capsule Take 1 capsule by mouth daily.   . nitroGLYCERIN (NITROSTAT) 0.4 MG SL tablet Place 1 tablet under the tongue every 5 (five) minutes  as needed.  . Omega-3 1000 MG CAPS Take 1 capsule by mouth 2 (two) times a day.  . Omeprazole (PRILOSEC PO) Take 1 tablet by mouth daily as needed.  . Potassium Chloride ER 20 MEQ TBCR Take 20 mEq by mouth daily.  . traMADol (ULTRAM) 50 MG tablet Take 50 mg by mouth every 6 (six) hours as needed.      Allergies:   Codeine   Social History   Tobacco Use  . Smoking status: Former Research scientist (life sciences)  . Smokeless tobacco: Former Systems developer  . Tobacco comment: quit smoking in 2000   Substance Use Topics   . Alcohol use: Not Currently  . Drug use: Not Currently     Family Hx: The patient's family history includes Cancer in his brother. There is no history of Colon cancer or Esophageal cancer.  ROS:   Please see the history of present illness.    Patient denies any chest pain orthopnea or PND his symptoms of dizziness have resolved after he cut back on the beta-blocker All other systems reviewed and are negative.   Prior CV studies:   The following studies were reviewed today:  I reviewed records from primary care physician's office  Labs/Other Tests and Data Reviewed:    EKG:  An ECG dated August 18, 2018 was personally reviewed today and demonstrated:  Sinus rhythm and Q waves in inferior and lateral leads.  Recent Labs: 06/05/2018: ALT 21; BUN 20; Creatinine, Ser 1.14; Hemoglobin 13.8; Platelets 104.0; Potassium 4.3; Sodium 137   Recent Lipid Panel No results found for: CHOL, TRIG, HDL, CHOLHDL, LDLCALC, LDLDIRECT  Wt Readings from Last 3 Encounters:  10/06/18 140 lb 6.4 oz (63.7 kg)  06/05/18 148 lb (67.1 kg)  10/11/17 160 lb (72.6 kg)     Objective:    Vital Signs:  BP 118/67 (BP Location: Left Arm, Patient Position: Sitting, Cuff Size: Normal)   Pulse 73   Ht 5\' 4"  (1.626 m)   Wt 140 lb 6.4 oz (63.7 kg)   BMI 24.10 kg/m    VITAL SIGNS:  reviewed Not performed.  Sorry vital signs were performed and reviewed  ASSESSMENT & PLAN:    1. Coronary artery disease: Secondary prevention stressed with the patient.  Importance of compliance with diet and medication stressed and he vocalized understanding.  I told him to be careful with diet and use a balanced diet.  He gets his blood work by his primary care physician and planning to do in the next few days.  I told him that this would be very important because of diuretic therapy. 2. Patient is not on statin therapy because of history of cirrhosis. 3. I would like to get an echocardiogram on him done in the next several weeks  after the viral pandemic subsides and we have his coronary artery disease history 4. Patient will be seen in follow-up appointment in 6 months or earlier if the patient has any concerns   COVID-19 Education: The signs and symptoms of COVID-19 were discussed with the patient and how to seek care for testing (follow up with PCP or arrange E-visit).  The importance of social distancing was discussed today.  Time:   Today, I have spent 27 minutes with the patient with telehealth technology discussing the above problems.  We tried to do Administrator but this failed and because of technical problems we pursued teleconferencing.   Medication Adjustments/Labs and Tests Ordered: Current medicines are reviewed at length with the patient today.  Concerns regarding medicines are outlined above.   Tests Ordered: No orders of the defined types were placed in this encounter.   Medication Changes: No orders of the defined types were placed in this encounter.   Disposition:  Follow up in 3 month(s)  Signed, Jenean Lindau, MD  10/06/2018 4:12 PM    [facility]

## 2018-11-07 ENCOUNTER — Other Ambulatory Visit: Payer: Medicare Other

## 2018-11-13 ENCOUNTER — Other Ambulatory Visit: Payer: Self-pay

## 2018-11-13 ENCOUNTER — Ambulatory Visit (INDEPENDENT_AMBULATORY_CARE_PROVIDER_SITE_OTHER): Payer: Medicare Other

## 2018-11-13 DIAGNOSIS — I2510 Atherosclerotic heart disease of native coronary artery without angina pectoris: Secondary | ICD-10-CM

## 2018-11-13 NOTE — Progress Notes (Signed)
Complete echocardiogram has been performed.  Jimmy Yosmar Ryker RDCS, RVT 

## 2018-11-17 ENCOUNTER — Telehealth: Payer: Self-pay

## 2018-11-17 NOTE — Telephone Encounter (Signed)
Information relayed to Mrs. Shallenberger, no further questions. Copy sent to Dr. Gentry Fitz per Dr. Docia Furl request.

## 2018-11-17 NOTE — Telephone Encounter (Signed)
-----   Message from Jenean Lindau, MD sent at 11/13/2018  4:36 PM EDT ----- The results of the study is unremarkable. Please inform patient. I will discuss in detail at next appointment. Cc  primary care/referring physician Jenean Lindau, MD 11/13/2018 4:36 PM

## 2019-01-07 DIAGNOSIS — E119 Type 2 diabetes mellitus without complications: Secondary | ICD-10-CM | POA: Diagnosis not present

## 2019-01-07 DIAGNOSIS — E785 Hyperlipidemia, unspecified: Secondary | ICD-10-CM | POA: Diagnosis not present

## 2019-01-07 DIAGNOSIS — I10 Essential (primary) hypertension: Secondary | ICD-10-CM | POA: Diagnosis not present

## 2019-01-07 DIAGNOSIS — Z9181 History of falling: Secondary | ICD-10-CM | POA: Diagnosis not present

## 2019-01-07 DIAGNOSIS — G4700 Insomnia, unspecified: Secondary | ICD-10-CM | POA: Diagnosis not present

## 2019-01-07 DIAGNOSIS — Z139 Encounter for screening, unspecified: Secondary | ICD-10-CM | POA: Diagnosis not present

## 2019-04-10 DIAGNOSIS — G4700 Insomnia, unspecified: Secondary | ICD-10-CM | POA: Diagnosis not present

## 2019-04-10 DIAGNOSIS — I10 Essential (primary) hypertension: Secondary | ICD-10-CM | POA: Diagnosis not present

## 2019-04-10 DIAGNOSIS — Z23 Encounter for immunization: Secondary | ICD-10-CM | POA: Diagnosis not present

## 2019-04-10 DIAGNOSIS — E119 Type 2 diabetes mellitus without complications: Secondary | ICD-10-CM | POA: Diagnosis not present

## 2019-04-10 DIAGNOSIS — E785 Hyperlipidemia, unspecified: Secondary | ICD-10-CM | POA: Diagnosis not present

## 2019-04-13 ENCOUNTER — Ambulatory Visit: Payer: Medicare Other | Admitting: Cardiology

## 2019-04-28 ENCOUNTER — Ambulatory Visit: Payer: Medicare Other | Admitting: Cardiology

## 2019-04-28 DIAGNOSIS — K7460 Unspecified cirrhosis of liver: Secondary | ICD-10-CM | POA: Diagnosis not present

## 2019-04-28 DIAGNOSIS — K8020 Calculus of gallbladder without cholecystitis without obstruction: Secondary | ICD-10-CM | POA: Diagnosis not present

## 2019-04-29 ENCOUNTER — Ambulatory Visit: Payer: Medicare Other | Admitting: Cardiology

## 2019-05-06 DIAGNOSIS — Z9181 History of falling: Secondary | ICD-10-CM | POA: Diagnosis not present

## 2019-05-06 DIAGNOSIS — E785 Hyperlipidemia, unspecified: Secondary | ICD-10-CM | POA: Diagnosis not present

## 2019-05-06 DIAGNOSIS — Z0000 Encounter for general adult medical examination without abnormal findings: Secondary | ICD-10-CM | POA: Diagnosis not present

## 2019-05-27 DIAGNOSIS — J449 Chronic obstructive pulmonary disease, unspecified: Secondary | ICD-10-CM | POA: Diagnosis not present

## 2019-05-28 ENCOUNTER — Telehealth: Payer: Self-pay | Admitting: Gastroenterology

## 2019-05-28 MED ORDER — POTASSIUM CHLORIDE ER 20 MEQ PO TBCR: 20.0000 meq | EXTENDED_RELEASE_TABLET | Freq: Every day | ORAL | 11 refills | Status: DC

## 2019-05-28 MED ORDER — FUROSEMIDE 40 MG PO TABS: 40.0000 mg | ORAL_TABLET | Freq: Every day | ORAL | 11 refills | Status: DC

## 2019-05-28 MED ORDER — CARVEDILOL 12.5 MG PO TABS: 12.5000 mg | ORAL_TABLET | Freq: Two times a day (BID) | ORAL | 11 refills | Status: DC

## 2019-05-28 NOTE — Telephone Encounter (Signed)
Patient was last seen 05/2018, would you like to refill these medications?

## 2019-05-28 NOTE — Telephone Encounter (Signed)
Pt needs rf for furosemide, potassium chloride, and cavedilol sent to [facility].

## 2019-05-28 NOTE — Telephone Encounter (Signed)
Sent prescriptions to patients pharmacy.

## 2019-05-28 NOTE — Telephone Encounter (Signed)
Please fill in all the medications Same doses as before, 11 refills RG

## 2019-06-02 DIAGNOSIS — K766 Portal hypertension: Secondary | ICD-10-CM | POA: Diagnosis not present

## 2019-06-05 ENCOUNTER — Other Ambulatory Visit: Payer: Self-pay

## 2019-06-05 ENCOUNTER — Encounter: Payer: Self-pay | Admitting: Cardiology

## 2019-06-05 ENCOUNTER — Ambulatory Visit (INDEPENDENT_AMBULATORY_CARE_PROVIDER_SITE_OTHER): Payer: Medicare Other | Admitting: Cardiology

## 2019-06-05 VITALS — BP 112/66 | HR 70 | Ht 63.0 in | Wt 143.0 lb

## 2019-06-05 DIAGNOSIS — I2510 Atherosclerotic heart disease of native coronary artery without angina pectoris: Secondary | ICD-10-CM | POA: Diagnosis not present

## 2019-06-05 DIAGNOSIS — K7030 Alcoholic cirrhosis of liver without ascites: Secondary | ICD-10-CM | POA: Diagnosis not present

## 2019-06-05 NOTE — Progress Notes (Signed)
Cardiology Office Note:    Date:  06/05/2019   ID:  Curtis Mullins, DOB 10-Jun-1946, MRN [id number]  PCP:  Earlyne Iba, NP  Cardiologist:  Jenean Lindau, MD   Referring MD: Earlyne Iba, NP    ASSESSMENT:    No diagnosis found. PLAN:    In order of problems listed above:  1. Coronary artery disease: Secondary prevention stressed with the patient.  Importance of compliance with diet and medication stressed and he vocalized understanding.  Importance of regular exercise stressed protocol explained. 2. Essential hypertension: Blood pressure is stable 3. Mixed dyslipidemia: Managed by primary care physician.  Diet was discussed 4. History of liver cirrhosis: Managed by primary care physician.  Told him to remind his doctor to send [place] a copy of blood work whenever it is done. 5. Patient will be seen in follow-up appointment in 6 months or earlier if the patient has any concerns    Medication Adjustments/Labs and Tests Ordered: Current medicines are reviewed at length with the patient today.  Concerns regarding medicines are outlined above.  No orders of the defined types were placed in this encounter.  No orders of the defined types were placed in this encounter.    No chief complaint on file.    History of Present Illness:    Curtis Mullins is a 73 y.o. male.  Patient has past medical history of coronary artery disease, essential hypertension and dyslipidemia.  He has history of cirrhosis of the liver.  This is managed by his primary care physician.  He takes care of activities of daily living and walks on a regular basis.  His blood work including lipids are followed by his primary care physician.  He is not on statin because of cirrhosis issues.  At the time of my evaluation, the patient is alert awake oriented and in no distress.  Past Medical History:  Diagnosis Date  . Barrett's esophagus   . CAD (coronary artery disease)   . Cirrhosis ([place])   . COPD (chronic  obstructive pulmonary disease) ([place])   . Esophageal varices ([place])   . Heart attack ([place]) 2000  . Hepatic encephalopathy ([place])   . Hypercholesteremia   . Hypertension     Past Surgical History:  Procedure Laterality Date  . cad s/p angioplasty  2000   stent placement   . CATARACT EXTRACTION    . COLONOSCOPY  05/07/2002   Diminutive colonic polyp status post polypectomy. Mild diverticulosis. Internal hemorrhoids  . ESOPHAGOGASTRODUODENOSCOPY  02/26/2013   Small esophageal varices (2 channels). Too small for endoscopic variceal ligation. Small fundal varices. Small hiatal hernia. Mild portal hypertensive gastropathy   . left ing Hernia Repair      Current Medications: Current Meds  Medication Sig  . carvedilol (COREG) 12.5 MG tablet Take 1 tablet (12.5 mg total) by mouth 2 (two) times daily. (Patient taking differently: Take 6.25 mg by mouth 2 (two) times daily. )  . enalapril (VASOTEC) 5 MG tablet Take 2.5 mg by mouth 2 (two) times daily.   . furosemide (LASIX) 40 MG tablet Take 1 tablet (40 mg total) by mouth daily.  Marland Kitchen lactulose (CHRONULAC) 10 GM/15ML solution Take 30 ml by mouth every other and PRN (Patient taking differently: daily. Take 15  ml by mouth every  and PRN)  . Multiple Vitamin (MULTIVITAMIN) capsule Take 1 capsule by mouth daily.   . nitroGLYCERIN (NITROSTAT) 0.4 MG SL tablet Place 1 tablet under the tongue every 5 (five)  minutes as needed.  . Omega-3 1000 MG CAPS Take 1 capsule by mouth 2 (two) times a day.  . Omeprazole (PRILOSEC PO) Take 1 tablet by mouth daily as needed.  . Potassium Chloride ER 20 MEQ TBCR Take 20 mEq by mouth daily.  . traMADol (ULTRAM) 50 MG tablet Take 50 mg by mouth every 6 (six) hours as needed.      Allergies:   Codeine   Social History   Socioeconomic History  . Marital status: Married    Spouse name: Not on file  . Number of children: 3  . Years of education: Not on file  . Highest education level: Not on file  Occupational  History  . Not on file  Tobacco Use  . Smoking status: Former Research scientist (life sciences)  . Smokeless tobacco: Former Systems developer  . Tobacco comment: quit smoking in 2000   Substance and Sexual Activity  . Alcohol use: Not Currently  . Drug use: Not Currently  . Sexual activity: Not on file  Other Topics Concern  . Not on file  Social History Narrative  . Not on file   Social Determinants of Health   Financial Resource Strain:   . Difficulty of Paying Living Expenses: Not on file  Food Insecurity:   . Worried About Charity fundraiser in the Last Year: Not on file  . Ran Out of Food in the Last Year: Not on file  Transportation Needs:   . Lack of Transportation (Medical): Not on file  . Lack of Transportation (Non-Medical): Not on file  Physical Activity:   . Days of Exercise per Week: Not on file  . Minutes of Exercise per Session: Not on file  Stress:   . Feeling of Stress : Not on file  Social Connections:   . Frequency of Communication with Friends and Family: Not on file  . Frequency of Social Gatherings with Friends and Family: Not on file  . Attends Religious Services: Not on file  . Active Member of Clubs or Organizations: Not on file  . Attends Archivist Meetings: Not on file  . Marital Status: Not on file     Family History: The patient's family history includes Cancer in his brother. There is no history of Colon cancer or Esophageal cancer.  ROS:   Please see the history of present illness.    All other systems reviewed and are negative.  EKGs/Labs/Other Studies Reviewed:    The following studies were reviewed today: IMPRESSIONS    1. The left ventricle has normal systolic function, with an ejection fraction of 55-60%. The cavity size was normal. Left ventricular diastolic Doppler parameters are consistent with impaired relaxation.  2. The right ventricle has normal systolic function. The cavity was normal. There is no increase in right ventricular wall thickness.   3. The mitral valve is grossly normal.    Recent Labs: No results found for requested labs within last 8760 hours.  Recent Lipid Panel No results found for: CHOL, TRIG, HDL, CHOLHDL, VLDL, LDLCALC, LDLDIRECT  Physical Exam:    VS:  BP 112/66 (BP Location: Left Arm, Patient Position: Sitting, Cuff Size: Normal)   Pulse 70   Ht 5\' 3"  (1.6 m)   Wt 143 lb (64.9 kg)   SpO2 96%   BMI 25.33 kg/m     Wt Readings from Last 3 Encounters:  06/05/19 143 lb (64.9 kg)  10/06/18 140 lb 6.4 oz (63.7 kg)  06/05/18 148 lb (67.1 kg)  GEN: Patient is in no acute distress HEENT: Normal NECK: No JVD; No carotid bruits LYMPHATICS: No lymphadenopathy CARDIAC: Hear sounds regular, 2/6 systolic murmur at the apex. RESPIRATORY:  Clear to auscultation without rales, wheezing or rhonchi  ABDOMEN: Soft, non-tender, non-distended MUSCULOSKELETAL:  No edema; No deformity  SKIN: Warm and dry NEUROLOGIC:  Alert and oriented x 3 PSYCHIATRIC:  Normal affect   Signed, Jenean Lindau, MD  06/05/2019 2:22 PM    [facility]

## 2019-06-05 NOTE — Patient Instructions (Signed)
Medication I nstructions:  Your physician recommends that you continue on your current medications as directed. Please refer to the Current Medication list given to you today.  *If you need a refill on your cardiac medications before your next appointment, please call your pharmacy*  Lab Work: None Ordered  Testing/Procedures: None Ordered   Follow-Up: At [facility], you and your health needs are our priority.  As part of our continuing mission to provide you with exceptional heart care, we have created designated Provider Care Teams.  These Care Teams include your primary Cardiologist (physician) and Advanced Practice Providers (APPs -  Physician Assistants and Nurse Practitioners) who all work together to provide you with the care you need, when you need it.  Your next appointment:   6 month(s)  The format for your next appointment:   In Person  Provider:   Rajan Revankar, MD  

## 2019-06-10 ENCOUNTER — Telehealth: Payer: Self-pay | Admitting: Gastroenterology

## 2019-06-23 ENCOUNTER — Telehealth (INDEPENDENT_AMBULATORY_CARE_PROVIDER_SITE_OTHER): Payer: Medicare Other | Admitting: Gastroenterology

## 2019-06-23 ENCOUNTER — Other Ambulatory Visit: Payer: Self-pay

## 2019-06-23 VITALS — Ht 63.0 in | Wt 133.0 lb

## 2019-06-23 DIAGNOSIS — K7290 Hepatic failure, unspecified without coma: Secondary | ICD-10-CM | POA: Diagnosis not present

## 2019-06-23 DIAGNOSIS — I701 Atherosclerosis of renal artery: Secondary | ICD-10-CM | POA: Diagnosis not present

## 2019-06-23 DIAGNOSIS — K7031 Alcoholic cirrhosis of liver with ascites: Secondary | ICD-10-CM

## 2019-06-23 DIAGNOSIS — K7682 Hepatic encephalopathy: Secondary | ICD-10-CM

## 2019-06-23 MED ORDER — POTASSIUM CHLORIDE CRYS ER 20 MEQ PO TBCR: 20.0000 meq | EXTENDED_RELEASE_TABLET | Freq: Two times a day (BID) | ORAL | 11 refills | Status: DC

## 2019-06-23 MED ORDER — OMEPRAZOLE 20 MG PO CPDR: 20.0000 mg | DELAYED_RELEASE_CAPSULE | Freq: Every day | ORAL | 11 refills | Status: DC

## 2019-06-23 MED ORDER — LACTULOSE 10 GM/15ML PO SOLN: ORAL | 11 refills | Status: DC

## 2019-06-23 MED ORDER — FUROSEMIDE 40 MG PO TABS: 40.0000 mg | ORAL_TABLET | Freq: Every day | ORAL | 11 refills | Status: DC

## 2019-06-23 MED ORDER — CARVEDILOL 12.5 MG PO TABS: 12.5000 mg | ORAL_TABLET | Freq: Two times a day (BID) | ORAL | 11 refills | Status: DC

## 2019-06-23 NOTE — Addendum Note (Signed)
Addended by: Karena Addison on: 06/23/2019 02:30 PM   Modules accepted: Orders

## 2019-06-23 NOTE — Patient Instructions (Addendum)
If you are age 74 or older, your body mass index should be between 23-30. Your Body mass index is 23.56 kg/m. If this is out of the aforementioned range listed, please consider follow up with your Primary Care Provider.  If you are age 67 or younger, your body mass index should be between 19-25. Your Body mass index is 23.56 kg/m. If this is out of the aformentioned range listed, please consider follow up with your Primary Care Provider.   We have sent the following medications to your pharmacy for you to pick up at your convenience: Lasix K Dur Lactulose Carvedilol Omeprazole  Follow up 1 year  Thank you,  Dr. Jackquline Denmark

## 2019-06-23 NOTE — Addendum Note (Signed)
Addended by: Karena Addison on: 06/23/2019 04:32 PM   Modules accepted: Orders

## 2019-06-23 NOTE — Progress Notes (Signed)
IMPRESSION and PLAN:    #1. ETOH Liver Cirrhosis with portal HTN (off ETOH since april 2006). Liver Bx 01/2005-grade 3, stage IV cirrhosis.  Complicated by portal hypertension (splenomegaly, thrombocytopenia/pancytopenia, eso varices with H/O UGI bleed s/p EVL, ascites and hepatic encephalopathy). MELD-Na: 8 (05/2019). Off rifaxamin d/t cost.  #2.  Esophageal varices s/p EVL 2007. EGD 02/2013-small esophageal varices, too small for EVL, small fundal varices, small hiatal hernia, mild portal hypertensive gastropathy, short segment Barrett's esophagus. Refuses any further EGDs.  #3.  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 Loletha Grayer CT was done for [facility] screening).  Patient does not want to pursue any further work-up/treatment.    #4. [place] screening. Neg [place] 2020, AFP 1.8 (05/2019).   Plan: - Continue Lasix 40mg  po qd #30, 11 refills. - KDur 20 mEq p.o. BID to continue, #60, 11 refills. - Resume lactulose 30 cc bid x 1 month (11 refills). Aim to have 2-3 soft BMs/day. - Continue salt and fluid restricted diet. - Continue Carvedilol 12.5mg  po bid, #60, 11 refills. - Offered EGD but he refuses again. Has transportation issues. - Continue omeprazole 20 mg p.o. once a day, 30, 11 refills. - FU in 12 months, earlier in case of any problems. - [place] report from [facility]. Continue [place] q50monthly. - Cannot drive to [facility]. Wants to continue care with Orlinda Blalock NP locally.  Recommend checking CBC, CMP, AFP, PT/INR every 6 mnts-1 year. - I have assured him and his wife that we will always be here to help in any way.  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 Loletha Grayer at [facility]. Saw Dr Loletha Grayer June 02, 2019.  Cannot drive to [employer] or [employer].  Very happy with Orlinda Blalock NP and would like to continue care locally for as much as  he can.  No complaints.  Wanted to get his medications refilled.  Stop rifaximin due to cost  Refuses EGD d/t transportation problems.  No nausea, vomiting, heartburn, regurgitation, odynophagia or dysphagia.  No significant diarrhea or constipation.  There is no melena or hematochezia. No unintentional weight loss.  No abdominal pain.  No jaundice dark urine or pale stools.  No change in mental status.  I have also talked to his wife.   Labs from 06/02/2019 were reviewed: Normal CMP with creatinine 1.1, albumin 3.9. K 3.6. Normal LFTs.  PT/INR 1.1.  Hemoglobin 12.4, platelets 92, WBC count 2.7.  AFP normal.  Past Medical History:  Diagnosis Date  . Barrett's esophagus   . CAD (coronary artery disease)   . Cirrhosis ([place])   . COPD (chronic obstructive pulmonary disease) ([place])   . Esophageal varices ([place])   . Heart attack ([place]) 2000  . Hepatic encephalopathy ([place])   . Hypercholesteremia   . Hypertension     Current Outpatient Medications  Medication Sig Dispense Refill  . carvedilol (COREG) 12.5 MG tablet Take 1 tablet (12.5 mg total) by mouth 2 (two) times daily. (Patient taking differently: Take 6.25 mg by mouth 2 (two) times daily. ) 60 tablet 11  . enalapril (VASOTEC) 5 MG tablet Take 2.5 mg by mouth 2 (two) times daily.     . furosemide (LASIX) 40 MG tablet Take 1 tablet (40 mg total) by mouth daily. 30 tablet 11  . lactulose (CHRONULAC) 10 GM/15ML solution Take 30  ml by mouth every other and PRN (Patient taking differently: 10 g daily. Take 15  ml by mouth every.  and PRN) 1800 mL 11  . Multiple Vitamin (MULTIVITAMIN) capsule Take 1 capsule by mouth daily.     . nitroGLYCERIN (NITROSTAT) 0.4 MG SL tablet Place 1 tablet under the tongue every 5 (five) minutes as needed.    . Omega-3 1000 MG CAPS Take 3 capsules by mouth daily.     . Omeprazole (PRILOSEC PO) Take 20 mg by mouth daily.     . Potassium Chloride ER 20 MEQ TBCR Take 20 mEq by mouth daily. 30 tablet 11  .  temazepam (RESTORIL) 30 MG capsule Take 30 mg by mouth at bedtime as needed.    . traMADol (ULTRAM) 50 MG tablet Take 50 mg by mouth every 6 (six) hours as needed.      No current facility-administered medications for this visit.    Family History  Problem Relation Age of Onset  . Cancer Brother        mouth per patient   . Colon cancer Neg Hx   . Esophageal cancer Neg Hx     Social History   Tobacco Use  . Smoking status: Former Research scientist (life sciences)  . Smokeless tobacco: Former Systems developer  . Tobacco comment: quit smoking in 2000   Substance Use Topics  . Alcohol use: Not Currently    Comment: quit in 2006  . Drug use: Not Currently    Allergies  Allergen Reactions  . Codeine      Review of Systems: All systems reviewed and negative except where noted in HPI.    Physical Exam:     Ht 5\' 3"  (1.6 m)   Wt 133 lb (60.3 kg)   BMI 23.56 kg/m  neg CBC Latest Ref Rng & Units 06/05/2018 10/11/2017  WBC 4.0 - 10.5 K/uL 2.5(L) 2.0 Repeated and verified X2.(L)  Hemoglobin 13.0 - 17.0 g/dL 13.8 12.6(L)  Hematocrit 39.0 - 52.0 % 41.1 37.6(L)  Platelets 150.0 - 400.0 K/uL 104.0(L) 97.0(L)   CMP Latest Ref Rng & Units 06/05/2018 10/11/2017  Glucose 70 - 99 mg/dL 112(H) 107(H)  BUN 6 - 23 mg/dL 20 18  Creatinine 0.40 - 1.50 mg/dL 1.14 0.99  Sodium 135 - 145 mEq/L 137 137  Potassium 3.5 - 5.1 mEq/L 4.3 4.2  Chloride 96 - 112 mEq/L 100 102  CO2 19 - 32 mEq/L 27 28  Calcium 8.4 - 10.5 mg/dL 10.1 9.2  Total Protein 6.0 - 8.3 g/dL 7.2 6.6  Total Bilirubin 0.2 - 1.2 mg/dL 0.9 0.7  Alkaline Phos 39 - 117 U/L 57 62  AST 0 - 37 U/L 27 26  ALT 0 - 53 U/L 21 21   I connected with  Lockie Mola on 06/23/19 by a video enabled telemedicine (switched to phone) application and verified that I am speaking with the correct person using two identifiers.   I discussed the limitations of evaluation and management by telemedicine. The patient expressed understanding and agreed to proceed.  Time spent on  review of records/call: 25 min    Katara Griner,MD 06/23/2019, 1:47 PM   CC Orlinda Blalock FNP

## 2019-07-06 DIAGNOSIS — E785 Hyperlipidemia, unspecified: Secondary | ICD-10-CM | POA: Diagnosis not present

## 2019-07-06 DIAGNOSIS — E119 Type 2 diabetes mellitus without complications: Secondary | ICD-10-CM | POA: Diagnosis not present

## 2019-08-25 ENCOUNTER — Other Ambulatory Visit: Payer: Self-pay

## 2019-08-25 NOTE — Patient Outreach (Signed)
[facility]) Care Management  08/25/2019  Curtis Mullins [birth year] [id number]   Medication Adherence call to Curtis Mullins [place] Identifiers Verify spoke with patient he is past due on Enalapril 5 mg patient explain he is taking it as needed some times he only takes it once daily other days he does not take,patient said he has gone over with his doctor,patient said he has plenty at this time. [name] is showing past due under [place].  [facility] Direct Dial [phone]  Fax [phone] [email or web address]

## 2019-11-10 DIAGNOSIS — I10 Essential (primary) hypertension: Secondary | ICD-10-CM | POA: Diagnosis not present

## 2019-11-10 DIAGNOSIS — E785 Hyperlipidemia, unspecified: Secondary | ICD-10-CM | POA: Diagnosis not present

## 2019-11-10 DIAGNOSIS — E119 Type 2 diabetes mellitus without complications: Secondary | ICD-10-CM | POA: Diagnosis not present

## 2019-12-08 DIAGNOSIS — K8020 Calculus of gallbladder without cholecystitis without obstruction: Secondary | ICD-10-CM | POA: Diagnosis not present

## 2019-12-10 ENCOUNTER — Ambulatory Visit: Payer: Medicare Other | Admitting: Cardiology

## 2020-01-12 ENCOUNTER — Ambulatory Visit: Payer: Medicare Other | Admitting: Cardiology

## 2020-02-12 DIAGNOSIS — I10 Essential (primary) hypertension: Secondary | ICD-10-CM | POA: Diagnosis not present

## 2020-02-12 DIAGNOSIS — E785 Hyperlipidemia, unspecified: Secondary | ICD-10-CM | POA: Diagnosis not present

## 2020-02-12 DIAGNOSIS — E119 Type 2 diabetes mellitus without complications: Secondary | ICD-10-CM | POA: Diagnosis not present

## 2020-02-12 DIAGNOSIS — Z139 Encounter for screening, unspecified: Secondary | ICD-10-CM | POA: Diagnosis not present

## 2020-02-25 ENCOUNTER — Ambulatory Visit (INDEPENDENT_AMBULATORY_CARE_PROVIDER_SITE_OTHER): Payer: Medicare Other | Admitting: Cardiology

## 2020-02-25 ENCOUNTER — Encounter: Payer: Self-pay | Admitting: Cardiology

## 2020-02-25 ENCOUNTER — Other Ambulatory Visit: Payer: Self-pay

## 2020-02-25 VITALS — BP 134/80 | HR 60 | Ht 63.0 in | Wt 143.6 lb

## 2020-02-25 DIAGNOSIS — I2510 Atherosclerotic heart disease of native coronary artery without angina pectoris: Secondary | ICD-10-CM

## 2020-02-25 DIAGNOSIS — R011 Cardiac murmur, unspecified: Secondary | ICD-10-CM

## 2020-02-25 DIAGNOSIS — K7030 Alcoholic cirrhosis of liver without ascites: Secondary | ICD-10-CM | POA: Diagnosis not present

## 2020-02-25 DIAGNOSIS — E782 Mixed hyperlipidemia: Secondary | ICD-10-CM | POA: Diagnosis not present

## 2020-02-25 MED ORDER — NITROGLYCERIN 0.4 MG SL SUBL: 0.4000 mg | SUBLINGUAL_TABLET | SUBLINGUAL | 3 refills | Status: DC | PRN

## 2020-02-25 NOTE — Patient Instructions (Signed)
Medication Instructions:  Your physician recommends that you continue on your current medications as directed. Please refer to the Current Medication list given to you today.  *If you need a refill on your cardiac medications before your next appointment, please call your pharmacy*   Lab Work: None ordered   If you have labs (blood work) drawn today and your tests are completely normal, you will receive your results only by:  [place] (if you have MyChart) OR  A paper copy in the mail If you have any lab test that is abnormal or we need to change your treatment, we will call you to review the results.   Testing/Procedures: Your physician has requested that you have an echocardiogram. Echocardiography is a painless test that uses sound waves to create images of your heart. It provides your doctor with information about the size and shape of your heart and how well your hearts chambers and valves are working. This procedure takes approximately one hour. There are no restrictions for this procedure.     Follow-Up: At [facility], you and your health needs are our priority.  As part of our continuing mission to provide you with exceptional heart care, we have created designated Provider Care Teams.  These Care Teams include your primary Cardiologist (physician) and Advanced Practice Providers (APPs -  Physician Assistants and Nurse Practitioners) who all work together to provide you with the care you need, when you need it.  We recommend signing up for the patient portal called "MyChart".  Sign up information is provided on this After Visit Summary.  MyChart is used to connect with patients for Virtual Visits (Telemedicine).  Patients are able to view lab/test results, encounter notes, upcoming appointments, etc.  Non-urgent messages can be sent to your provider as well.   To learn more about what you can do with MyChart, go to [email or web address].    Your next appointment:    6 month(s)  The format for your next appointment:   In Person  Provider:   Jyl Heinz, MD   Other Instructions None

## 2020-02-25 NOTE — Progress Notes (Signed)
Cardiology Office Note:    Date:  02/25/2020   ID:  [name], DOB 17-Feb-1946, MRN [id number]  PCP:  Earlyne Iba, NP  Cardiologist:  Jenean Lindau, MD   Referring MD: Earlyne Iba, NP    ASSESSMENT:    1. Coronary artery disease involving native coronary artery of native heart without angina pectoris   2. Alcoholic cirrhosis of liver without ascites ([place])   3. Mixed dyslipidemia   4. Cardiac murmur    PLAN:    In order of problems listed above:  1. Coronary artery disease: Secondary prevention stressed with the patient.  Importance of compliance with diet medication stressed and he vocalized understanding.  Importance of regular exercise stressed.  He is walking on a regular basis. 2. Mixed dyslipidemia: Diet was emphasized.  He mentions to me that because of cirrhosis he has been told not to engage in alcohol use.  He also has been told to abstain from statin therapy.  I respect his wishes. 3. Alcoholic cirrhosis: Appears to be stable at this time and monitored by his gastroenterologist. 4. Cardiac murmur: Echocardiogram will be done to assess murmur heard on auscultation. 5. Patient will be seen in follow-up appointment in 6 months or earlier if the patient has any concerns.  Sublingual nitroglycerin prescription was sent, its protocol and 911 protocol explained and the patient vocalized understanding questions were answered to the patient's satisfaction   Medication Adjustments/Labs and Tests Ordered: Current medicines are reviewed at length with the patient today.  Concerns regarding medicines are outlined above.  No orders of the defined types were placed in this encounter.  No orders of the defined types were placed in this encounter.    No chief complaint on file.    History of Present Illness:    Curtis Mullins is a 74 y.o. male.  Patient has past medical history of coronary disease, mixed dyslipidemia, hypertension and history of cirrhosis.  He denies any  problems at this time and takes care of activities of daily living.  No chest pain orthopnea or PND.  At the time of my evaluation, the patient is alert awake oriented and in no distress.  Past Medical History:  Diagnosis Date  . Barrett's esophagus   . CAD (coronary artery disease)   . Cirrhosis ([place])   . COPD (chronic obstructive pulmonary disease) ([place])   . Esophageal varices ([place])   . Heart attack ([place]) 2000  . Hepatic encephalopathy ([place])   . Hypercholesteremia   . Hypertension     Past Surgical History:  Procedure Laterality Date  . cad s/p angioplasty  2000   stent placement   . CATARACT EXTRACTION    . COLONOSCOPY  05/07/2002   Diminutive colonic polyp status post polypectomy. Mild diverticulosis. Internal hemorrhoids  . ESOPHAGOGASTRODUODENOSCOPY  02/26/2013   Small esophageal varices (2 channels). Too small for endoscopic variceal ligation. Small fundal varices. Small hiatal hernia. Mild portal hypertensive gastropathy   . left ing Hernia Repair  08/2018    Current Medications: Current Meds  Medication Sig  . carvedilol (COREG) 12.5 MG tablet Take 1 tablet (12.5 mg total) by mouth 2 (two) times daily.  . enalapril (VASOTEC) 5 MG tablet Take 2.5 mg by mouth 2 (two) times daily.   . furosemide (LASIX) 40 MG tablet Take 1 tablet (40 mg total) by mouth daily.  Marland Kitchen lactulose (CHRONULAC) 10 GM/15ML solution Take 30 ml by mouth twice daily. (Aim 2-3 Bowel movements a day)  .  Multiple Vitamin (MULTIVITAMIN) capsule Take 1 capsule by mouth daily.   . nitroGLYCERIN (NITROSTAT) 0.4 MG SL tablet Place 1 tablet under the tongue every 5 (five) minutes as needed.  . Omega-3 1000 MG CAPS Take 3 capsules by mouth daily.   Marland Kitchen omeprazole (PRILOSEC) 20 MG capsule Take 1 capsule (20 mg total) by mouth daily.  . potassium chloride SA (KLOR-CON) 20 MEQ tablet Take 1 tablet (20 mEq total) by mouth 2 (two) times daily.  . temazepam (RESTORIL) 30 MG capsule Take 30 mg by mouth at bedtime as  needed.  . traMADol (ULTRAM) 50 MG tablet Take 50 mg by mouth every 6 (six) hours as needed.      Allergies:   Codeine   Social History   Socioeconomic History  . Marital status: Married    Spouse name: Not on file  . Number of children: 3  . Years of education: Not on file  . Highest education level: Not on file  Occupational History  . Not on file  Tobacco Use  . Smoking status: Former Research scientist (life sciences)  . Smokeless tobacco: Former Systems developer  . Tobacco comment: quit smoking in 2000   Vaping Use  . Vaping Use: Never used  Substance and Sexual Activity  . Alcohol use: Not Currently    Comment: quit in 2006  . Drug use: Not Currently  . Sexual activity: Not on file  Other Topics Concern  . Not on file  Social History Narrative  . Not on file   Social Determinants of Health   Financial Resource Strain:   . Difficulty of Paying Living Expenses: Not on file  Food Insecurity:   . Worried About Charity fundraiser in the Last Year: Not on file  . Ran Out of Food in the Last Year: Not on file  Transportation Needs:   . Lack of Transportation (Medical): Not on file  . Lack of Transportation (Non-Medical): Not on file  Physical Activity:   . Days of Exercise per Week: Not on file  . Minutes of Exercise per Session: Not on file  Stress:   . Feeling of Stress : Not on file  Social Connections:   . Frequency of Communication with Friends and Family: Not on file  . Frequency of Social Gatherings with Friends and Family: Not on file  . Attends Religious Services: Not on file  . Active Member of Clubs or Organizations: Not on file  . Attends Archivist Meetings: Not on file  . Marital Status: Not on file     Family History: The patient's family history includes Cancer in his brother. There is no history of Colon cancer or Esophageal cancer.  ROS:   Please see the history of present illness.    All other systems reviewed and are negative.  EKGs/Labs/Other Studies Reviewed:      The following studies were reviewed today: I discussed my findings with the patient at length.   Recent Labs: No results found for requested labs within last 8760 hours.  Recent Lipid Panel No results found for: CHOL, TRIG, HDL, CHOLHDL, VLDL, LDLCALC, LDLDIRECT  Physical Exam:    VS:  BP 134/80   Pulse 60   Ht 5\' 3"  (1.6 m)   Wt 143 lb 9.6 oz (65.1 kg)   SpO2 98%   BMI 25.44 kg/m     Wt Readings from Last 3 Encounters:  02/25/20 143 lb 9.6 oz (65.1 kg)  06/23/19 133 lb (60.3 kg)  06/05/19 143  lb (64.9 kg)     GEN: Patient is in no acute distress HEENT: Normal NECK: No JVD; No carotid bruits LYMPHATICS: No lymphadenopathy CARDIAC: Hear sounds regular, 2/6 systolic murmur at the apex. RESPIRATORY:  Clear to auscultation without rales, wheezing or rhonchi  ABDOMEN: Soft, non-tender, non-distended MUSCULOSKELETAL:  No edema; No deformity  SKIN: Warm and dry NEUROLOGIC:  Alert and oriented x 3 PSYCHIATRIC:  Normal affect   Signed, Jenean Lindau, MD  02/25/2020 1:36 PM    [facility]

## 2020-03-18 ENCOUNTER — Other Ambulatory Visit: Payer: Medicare Other

## 2020-04-08 ENCOUNTER — Other Ambulatory Visit: Payer: Medicare Other

## 2020-04-28 ENCOUNTER — Ambulatory Visit (INDEPENDENT_AMBULATORY_CARE_PROVIDER_SITE_OTHER): Payer: Medicare Other

## 2020-04-28 ENCOUNTER — Other Ambulatory Visit: Payer: Self-pay

## 2020-04-28 DIAGNOSIS — R011 Cardiac murmur, unspecified: Secondary | ICD-10-CM

## 2020-04-28 NOTE — Progress Notes (Signed)
Complete echocardiogram performed.  Jimmy Rayane Gallardo RDCS, RVT  

## 2020-05-01 LAB — ECHOCARDIOGRAM COMPLETE
P 1/2 time: 547 msec
S' Lateral: 4.4 cm

## 2020-05-26 DIAGNOSIS — G8929 Other chronic pain: Secondary | ICD-10-CM | POA: Diagnosis not present

## 2020-05-26 DIAGNOSIS — I10 Essential (primary) hypertension: Secondary | ICD-10-CM | POA: Diagnosis not present

## 2020-05-26 DIAGNOSIS — J449 Chronic obstructive pulmonary disease, unspecified: Secondary | ICD-10-CM | POA: Diagnosis not present

## 2020-05-26 DIAGNOSIS — E119 Type 2 diabetes mellitus without complications: Secondary | ICD-10-CM | POA: Diagnosis not present

## 2020-05-26 DIAGNOSIS — E785 Hyperlipidemia, unspecified: Secondary | ICD-10-CM | POA: Diagnosis not present

## 2020-05-26 LAB — PROTIME-INR: INR: 1.1 (ref 0.9–1.1)

## 2020-06-01 DIAGNOSIS — K8020 Calculus of gallbladder without cholecystitis without obstruction: Secondary | ICD-10-CM | POA: Diagnosis not present

## 2020-06-14 ENCOUNTER — Other Ambulatory Visit: Payer: Self-pay | Admitting: Gastroenterology

## 2020-06-28 DIAGNOSIS — Z9181 History of falling: Secondary | ICD-10-CM | POA: Diagnosis not present

## 2020-06-28 DIAGNOSIS — E785 Hyperlipidemia, unspecified: Secondary | ICD-10-CM | POA: Diagnosis not present

## 2020-06-28 DIAGNOSIS — G4700 Insomnia, unspecified: Secondary | ICD-10-CM | POA: Diagnosis not present

## 2020-06-28 DIAGNOSIS — G8929 Other chronic pain: Secondary | ICD-10-CM | POA: Diagnosis not present

## 2020-06-28 DIAGNOSIS — E119 Type 2 diabetes mellitus without complications: Secondary | ICD-10-CM | POA: Diagnosis not present

## 2020-06-28 DIAGNOSIS — I10 Essential (primary) hypertension: Secondary | ICD-10-CM | POA: Diagnosis not present

## 2020-06-28 DIAGNOSIS — J449 Chronic obstructive pulmonary disease, unspecified: Secondary | ICD-10-CM | POA: Diagnosis not present

## 2020-06-28 DIAGNOSIS — Z6824 Body mass index (BMI) 24.0-24.9, adult: Secondary | ICD-10-CM | POA: Diagnosis not present

## 2020-07-21 ENCOUNTER — Other Ambulatory Visit: Payer: Self-pay | Admitting: Gastroenterology

## 2020-07-29 ENCOUNTER — Encounter: Payer: Self-pay | Admitting: General Surgery

## 2020-07-29 ENCOUNTER — Telehealth (INDEPENDENT_AMBULATORY_CARE_PROVIDER_SITE_OTHER): Payer: Medicare Other | Admitting: Gastroenterology

## 2020-07-29 ENCOUNTER — Telehealth: Payer: Self-pay | Admitting: General Surgery

## 2020-07-29 ENCOUNTER — Encounter: Payer: Self-pay | Admitting: Gastroenterology

## 2020-07-29 DIAGNOSIS — I701 Atherosclerosis of renal artery: Secondary | ICD-10-CM

## 2020-07-29 DIAGNOSIS — I8500 Esophageal varices without bleeding: Secondary | ICD-10-CM | POA: Diagnosis not present

## 2020-07-29 DIAGNOSIS — K7460 Unspecified cirrhosis of liver: Secondary | ICD-10-CM

## 2020-07-29 DIAGNOSIS — K7682 Hepatic encephalopathy: Secondary | ICD-10-CM

## 2020-07-29 DIAGNOSIS — K7290 Hepatic failure, unspecified without coma: Secondary | ICD-10-CM | POA: Diagnosis not present

## 2020-07-29 DIAGNOSIS — K7031 Alcoholic cirrhosis of liver with ascites: Secondary | ICD-10-CM

## 2020-07-29 MED ORDER — POTASSIUM CHLORIDE CRYS ER 20 MEQ PO TBCR: 20.0000 meq | EXTENDED_RELEASE_TABLET | Freq: Two times a day (BID) | ORAL | 11 refills | Status: DC

## 2020-07-29 MED ORDER — CARVEDILOL 12.5 MG PO TABS: 12.5000 mg | ORAL_TABLET | Freq: Two times a day (BID) | ORAL | 11 refills | Status: DC

## 2020-07-29 MED ORDER — LACTULOSE 10 GM/15ML PO SOLN: ORAL | 11 refills | Status: DC

## 2020-07-29 MED ORDER — OMEPRAZOLE 20 MG PO CPDR: 20.0000 mg | DELAYED_RELEASE_CAPSULE | Freq: Every day | ORAL | 11 refills | Status: DC

## 2020-07-29 MED ORDER — FUROSEMIDE 40 MG PO TABS: 40.0000 mg | ORAL_TABLET | Freq: Every day | ORAL | 11 refills | Status: DC

## 2020-07-29 NOTE — Telephone Encounter (Signed)
Left a voicemail on AM for the patient to call to update his med record prior to his virtual appointment today

## 2020-07-29 NOTE — Progress Notes (Addendum)
IMPRESSION and PLAN:    #1. ETOH Liver Cirrhosis with portal HTN (off ETOH since april 2006). Liver Bx 01/2005-grade 3, stage IV cirrhosis.  Complicated by portal hypertension (splenomegaly, thrombocytopenia/pancytopenia, eso varices with H/O UGI bleed s/p EVL, ascites and hepatic encephalopathy). MELD-Na: 8 (05/2019). Off rifaxamin d/t cost.  #2.  Esophageal varices s/p EVL 2007. EGD 02/2013-small esophageal varices, too small for EVL, small fundal varices, small hiatal hernia, mild portal hypertensive gastropathy, short segment Barrett's esophagus. Refuses any further EGDs.  #3.  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 Loletha Grayer CT was done for [facility] screening).  Patient does not want to pursue any further work-up/treatment.    #4. [place] screening. Neg [place] 2020, AFP 1.8 (05/2019).   Plan: - Continue Lasix 40mg  po qd #30, 11 refills. - KDur 20 mEq p.o. BID to continue, #60, 11 refills. - Resume lactulose 30 cc bid x 1 month (11 refills). Aim to have 2-3 soft BMs/day. - Continue salt and fluid restricted diet. - Continue Carvedilol 12.5mg  po bid, #60, 11 refills (d/t HTN) - Offered EGD but he refuses again. Has transportation issues. - Continue omeprazole 20 mg p.o. once a day, 30, 11 refills. - FU in 12 months, earlier in case of any problems. - [place] abdo report from [address] to [facility]. Wants to continue care locally.  Recommend checking CBC, CMP, AFP, PT/INR every 6 mnts-1 year. - I have assured him and his wife that we will always be here to help in any way.   Addendum: Records from [facility] received: Ultrasound 06/01/2020: Cholelithiasis, cirrhosis, indeterminate ill-defined lesion in the left lobe of the liver.  No ascites.  MRI recommended.  Plan: Proceed with MR liver with contrast (can be scheduled at [facility]).  Sent staff message to [place].  Also, get CBC, CMP, PT/INR, AFP at the same time.  HPI:    Chief Complaint:    Patient is a 75 year old very pleasant white male with advanced alcoholic liver cirrhosis with portal hypertension being followed by Dr. Loletha Grayer at [facility]. Saw Dr Loletha Grayer June 02, 2019.  Cannot drive to [facility] or [employer].  Was very happy with Orlinda Blalock NP, but she has left as well. He is with new PA would like to continue care locally for as much as he can.  This is a virtual visit due to transportation problems.  He also refuses to have EGD due to transportation problems.  No complaints.  Wanted to get his medications refilled.  Stop rifaximin due to cost   No nausea, vomiting, heartburn, regurgitation, odynophagia or dysphagia.  No significant diarrhea or constipation. There is no melena or hematochezia. No unintentional weight loss.  No abdominal pain.  No jaundice dark urine or pale stools.  No change in mental status.  I have also talked to his wife.   Labs from 06/02/2019 were reviewed: Normal CMP with creatinine 1.1, albumin 3.9. K 3.6. Normal LFTs.  PT/INR 1.1.  Hemoglobin 12.4, platelets 92, WBC count 2.7.  AFP normal.  Past Medical History:  Diagnosis Date  . Barrett's esophagus   . CAD (coronary artery disease)   . Cirrhosis ([place])   . COPD (chronic obstructive pulmonary disease) ([place])   . Esophageal varices ([place])   . Heart attack ([place]) 2000  . Hepatic encephalopathy ([place])   . Hypercholesteremia   . Hypertension     Current Outpatient Medications  Medication Sig Dispense Refill  .  carvedilol (COREG) 12.5 MG tablet Take 1 tablet (12.5 mg total) by mouth 2 (two) times daily. 60 tablet 11  . enalapril (VASOTEC) 5 MG tablet Take 2.5 mg by mouth 2 (two) times daily.     . furosemide (LASIX) 40 MG tablet Take 1 tablet (40 mg total) by mouth daily. 30 tablet 11  . lactulose (CHRONULAC) 10 GM/15ML solution Take 30 ml by mouth twice daily. (Aim 2-3 Bowel movements a day) 1800 mL 11  . Multiple Vitamin (MULTIVITAMIN) capsule Take 1 capsule by mouth  daily.     . nitroGLYCERIN (NITROSTAT) 0.4 MG SL tablet Place 1 tablet (0.4 mg total) under the tongue every 5 (five) minutes as needed. 25 tablet 3  . Omega-3 1000 MG CAPS Take 3 capsules by mouth daily.     Marland Kitchen omeprazole (PRILOSEC) 20 MG capsule TAKE 1 CAPSULE BY MOUTH DAILY 30 capsule 11  . potassium chloride SA (KLOR-CON) 20 MEQ tablet TAKE 1 TABLET BY MOUTH 2 TIMES DAILY 60 tablet 11  . temazepam (RESTORIL) 30 MG capsule Take 30 mg by mouth at bedtime as needed.    . traMADol (ULTRAM) 50 MG tablet Take 50 mg by mouth every 6 (six) hours as needed.      No current facility-administered medications for this visit.    Family History  Problem Relation Age of Onset  . Cancer Brother        mouth per patient   . Colon cancer Neg Hx   . Esophageal cancer Neg Hx     Social History   Tobacco Use  . Smoking status: Former Research scientist (life sciences)  . Smokeless tobacco: Former Systems developer  . Tobacco comment: quit smoking in 2000   Vaping Use  . Vaping Use: Never used  Substance Use Topics  . Alcohol use: Not Currently    Comment: quit in 2006  . Drug use: Not Currently    Allergies  Allergen Reactions  . Codeine      Review of Systems: All systems reviewed and negative except where noted in HPI.    Physical Exam:     There were no vitals taken for this visit. neg CBC Latest Ref Rng & Units 06/05/2018 10/11/2017  WBC 4.0 - 10.5 K/uL 2.5(L) 2.0 Repeated and verified X2.(L)  Hemoglobin 13.0 - 17.0 g/dL 13.8 12.6(L)  Hematocrit 39.0 - 52.0 % 41.1 37.6(L)  Platelets 150.0 - 400.0 K/uL 104.0(L) 97.0(L)   CMP Latest Ref Rng & Units 06/05/2018 10/11/2017  Glucose 70 - 99 mg/dL 112(H) 107(H)  BUN 6 - 23 mg/dL 20 18  Creatinine 0.40 - 1.50 mg/dL 1.14 0.99  Sodium 135 - 145 mEq/L 137 137  Potassium 3.5 - 5.1 mEq/L 4.3 4.2  Chloride 96 - 112 mEq/L 100 102  CO2 19 - 32 mEq/L 27 28  Calcium 8.4 - 10.5 mg/dL 10.1 9.2  Total Protein 6.0 - 8.3 g/dL 7.2 6.6  Total Bilirubin 0.2 - 1.2 mg/dL 0.9 0.7   Alkaline Phos 39 - 117 U/L 57 62  AST 0 - 37 U/L 27 26  ALT 0 - 53 U/L 21 21   I connected with  Lockie Mola on 07/29/20 by a video enabled telemedicine (switched to phone) application and verified that I am speaking with the correct person using two identifiers.   I discussed the limitations of evaluation and management by telemedicine. The patient expressed understanding and agreed to proceed.  Physician was in office.  Patient at home.  Time spent on review of  records/call/nurse to: Prescriptions: 30 min    Maygan Koeller,MD 07/29/2020, 2:10 PM   CC [place]

## 2020-07-29 NOTE — Patient Instructions (Addendum)
If you are age 75 or older, your body mass index should be between 23-30. Your There is no height or weight on file to calculate BMI. If this is out of the aforementioned range listed, please consider follow up with your Primary Care Provider.  If you are age 29 or younger, your body mass index should be between 19-25. Your There is no height or weight on file to calculate BMI. If this is out of the aformentioned range listed, please consider follow up with your Primary Care Provider.   It has been recommended to you by your physician that you have a(n) EGD completed. Per your request, we did not schedule the procedure(s) today. Please contact our office at [phone] should you decide to have the procedure completed. You will be scheduled for a pre-visit and procedure at that time.  Contine salt and fluid restriction diet Continue Coreg, lasix, omeprazole, potassium. Resume lactulose All medications above have been sent to the pharmacy  Follow up in 1 year. Please call sooner for any questions or concerns  Thank you,  Dr. Jackquline Denmark

## 2020-07-29 NOTE — Telephone Encounter (Signed)
Inbound call from patient returning your call.  States medications have not changed since the last we saw him.

## 2020-08-01 ENCOUNTER — Telehealth: Payer: Self-pay

## 2020-08-01 ENCOUNTER — Other Ambulatory Visit: Payer: Self-pay | Admitting: Gastroenterology

## 2020-08-01 DIAGNOSIS — K7460 Unspecified cirrhosis of liver: Secondary | ICD-10-CM

## 2020-08-01 NOTE — Telephone Encounter (Signed)
Orders for liver MRI and labs sent to [facility]. Patient has been notified that [facility] will contact him for a date and time. He will get labs done this week.

## 2020-08-01 NOTE — Telephone Encounter (Signed)
-----   Message from Jackquline Denmark, MD sent at 07/29/2020  5:18 PM EST ----- Addendum: Records from [facility] received: Ultrasound 06/01/2020: Cholelithiasis, cirrhosis, indeterminate ill-defined lesion in the left lobe of the liver.  No ascites.  MRI recommended.  Plan: Proceed with MR liver with contrast (can be scheduled at [facility]).  Sent staff message to [place].  Also, get CBC, CMP, PT/INR, AFP at the same time.   RG

## 2020-08-02 NOTE — Telephone Encounter (Signed)
Noted  

## 2020-08-24 ENCOUNTER — Ambulatory Visit: Payer: Medicare Other | Admitting: Cardiology

## 2020-09-19 ENCOUNTER — Ambulatory Visit: Payer: Medicare Other | Admitting: Cardiology

## 2020-09-30 NOTE — Progress Notes (Signed)
Did he get MRI liver with contrast done?  RE: abn [place]. If not, lets set him up Also, CBC, CMP, PT INR, AFP at time of MRI Pl arrange all at the same time since he has transportation problems  RG

## 2020-10-07 DIAGNOSIS — K766 Portal hypertension: Secondary | ICD-10-CM | POA: Diagnosis not present

## 2020-10-07 DIAGNOSIS — K8020 Calculus of gallbladder without cholecystitis without obstruction: Secondary | ICD-10-CM | POA: Diagnosis not present

## 2020-10-07 DIAGNOSIS — K7460 Unspecified cirrhosis of liver: Secondary | ICD-10-CM | POA: Diagnosis not present

## 2020-10-07 DIAGNOSIS — K8080 Other cholelithiasis without obstruction: Secondary | ICD-10-CM | POA: Diagnosis not present

## 2020-10-10 ENCOUNTER — Telehealth: Payer: Self-pay | Admitting: Gastroenterology

## 2020-10-10 NOTE — Telephone Encounter (Signed)
Went to [facility] again. LVM  Someone needs to read my last telephone encounter to the patient regarding his labs at [facility]. He needs to call them to make sure they are still good and call us back if they aren't

## 2020-10-10 NOTE — Telephone Encounter (Signed)
Called patient 3 times and going straight to vociemail. LVM  Just had MRI done 4-20. Labs are usually good for a year. Patient needs to call [facility] about that

## 2020-10-10 NOTE — Telephone Encounter (Signed)
Brooke please see the note below. Would [facility] still have these orders?

## 2020-10-11 NOTE — Telephone Encounter (Signed)
Inbound call from patient wife. I informed her about the telephone note about labs being good for a year and to contact [facility] if she have other questions regarding them. She expressed understanding.

## 2020-10-11 NOTE — Telephone Encounter (Signed)
CBC with Diff, CMP,AFP and PT/INR lab sent to [facility]

## 2020-10-11 NOTE — Telephone Encounter (Signed)
Patient wife called in again. She stated she just called [facility] states patient will need new lab orders because he never had labs in February. Please call [phone]

## 2020-10-18 DIAGNOSIS — K7460 Unspecified cirrhosis of liver: Secondary | ICD-10-CM | POA: Diagnosis not present

## 2020-10-18 DIAGNOSIS — K7290 Hepatic failure, unspecified without coma: Secondary | ICD-10-CM | POA: Diagnosis not present

## 2020-10-19 ENCOUNTER — Telehealth: Payer: Self-pay | Admitting: Gastroenterology

## 2020-10-19 DIAGNOSIS — J449 Chronic obstructive pulmonary disease, unspecified: Secondary | ICD-10-CM | POA: Diagnosis not present

## 2020-10-19 DIAGNOSIS — G8929 Other chronic pain: Secondary | ICD-10-CM | POA: Diagnosis not present

## 2020-10-19 DIAGNOSIS — E785 Hyperlipidemia, unspecified: Secondary | ICD-10-CM | POA: Diagnosis not present

## 2020-10-19 DIAGNOSIS — I10 Essential (primary) hypertension: Secondary | ICD-10-CM | POA: Diagnosis not present

## 2020-10-19 DIAGNOSIS — G4700 Insomnia, unspecified: Secondary | ICD-10-CM | POA: Diagnosis not present

## 2020-10-19 DIAGNOSIS — E119 Type 2 diabetes mellitus without complications: Secondary | ICD-10-CM | POA: Diagnosis not present

## 2020-10-19 NOTE — Telephone Encounter (Addendum)
Due to patient and his no show/rescheduling it is best that patient calls. Fax number is [phone] and telephone number is [phone] for him to call to schedule with Dr Loletha Grayer or PA the soonest they had was May 16th. For new dx [place] 2.9cm left lobe normal AFP. Will sent referral soon

## 2020-10-19 NOTE — Telephone Encounter (Signed)
Due to patient and his no show/rescheduling it is best that patient calls. Fax number is [phone] and telephone number is [phone] for him to call to schedule with Dr Loletha Grayer or PA the soonest they had was May 16th. For new dx [place] 2.9cm left lobe normal AFP. Will sent referral soon.  Called patient and no one answered

## 2020-10-19 NOTE — Telephone Encounter (Signed)
I have called pt few times Left messages   MRI abdomen with and without contrast 10/07/2020 (d/t abnormal ultrasound) shows -2.9 cm mass in segment 2 of left hepatic lobe, highly suspicious for HCC. New as compared to CT 2019 -Underlying liver cirrhosis and findings of portal venous hypertension -Known cholelithiasis  Labs from 10/18/2020 -Normal CMP with albumin 3.7, Cr 0.8, TB 0.7, Na 137 -CBC hemoglobin 12.3, platelets 121, WBC count 2.6 -PT INR 1.1 -AFP 5.3 (N <8.4)  MELD-Na  10 Child's Class A   Plan: -Looks like it should be resectable. -Please send above MRI report, labs to Dr. Enis Gash, [facility] hepatology. -Also I would suggest to make FU appointment with her as well.  Jerene Pitch will work on this.   RG

## 2020-10-19 NOTE — Telephone Encounter (Signed)
On review AFP was 1.2 05/2019 RG

## 2020-10-20 NOTE — Telephone Encounter (Signed)
Wife returned call to discuss MRI results. [phone]. States its best to call after 11am

## 2020-10-21 DIAGNOSIS — M47819 Spondylosis without myelopathy or radiculopathy, site unspecified: Secondary | ICD-10-CM | POA: Diagnosis not present

## 2020-10-21 DIAGNOSIS — I2510 Atherosclerotic heart disease of native coronary artery without angina pectoris: Secondary | ICD-10-CM | POA: Diagnosis not present

## 2020-10-21 DIAGNOSIS — J449 Chronic obstructive pulmonary disease, unspecified: Secondary | ICD-10-CM | POA: Diagnosis not present

## 2020-10-21 DIAGNOSIS — J8410 Pulmonary fibrosis, unspecified: Secondary | ICD-10-CM | POA: Diagnosis not present

## 2020-10-21 DIAGNOSIS — J439 Emphysema, unspecified: Secondary | ICD-10-CM | POA: Diagnosis not present

## 2020-10-21 NOTE — Telephone Encounter (Signed)
Patient was given phone number to call the clinic to schedule the appointment

## 2020-10-21 NOTE — Telephone Encounter (Signed)
Faxed referral to [facility] with recent MRI, labs, last ov, demographics, and insurance card

## 2020-11-15 DIAGNOSIS — K769 Liver disease, unspecified: Secondary | ICD-10-CM | POA: Diagnosis not present

## 2020-11-15 DIAGNOSIS — K7460 Unspecified cirrhosis of liver: Secondary | ICD-10-CM | POA: Diagnosis not present

## 2020-11-19 DIAGNOSIS — K7689 Other specified diseases of liver: Secondary | ICD-10-CM | POA: Diagnosis not present

## 2020-11-19 DIAGNOSIS — K7469 Other cirrhosis of liver: Secondary | ICD-10-CM | POA: Diagnosis not present

## 2020-11-19 DIAGNOSIS — K769 Liver disease, unspecified: Secondary | ICD-10-CM | POA: Diagnosis not present

## 2020-11-23 DIAGNOSIS — Z0000 Encounter for general adult medical examination without abnormal findings: Secondary | ICD-10-CM | POA: Diagnosis not present

## 2020-11-23 DIAGNOSIS — E785 Hyperlipidemia, unspecified: Secondary | ICD-10-CM | POA: Diagnosis not present

## 2020-11-23 DIAGNOSIS — Z9181 History of falling: Secondary | ICD-10-CM | POA: Diagnosis not present

## 2020-11-24 ENCOUNTER — Ambulatory Visit: Payer: Medicare Other | Admitting: Cardiology

## 2020-11-30 DIAGNOSIS — C220 Liver cell carcinoma: Secondary | ICD-10-CM | POA: Diagnosis not present

## 2020-11-30 DIAGNOSIS — D490 Neoplasm of unspecified behavior of digestive system: Secondary | ICD-10-CM | POA: Diagnosis not present

## 2020-11-30 DIAGNOSIS — M545 Low back pain, unspecified: Secondary | ICD-10-CM | POA: Diagnosis not present

## 2020-11-30 DIAGNOSIS — K7469 Other cirrhosis of liver: Secondary | ICD-10-CM | POA: Diagnosis not present

## 2020-12-01 DIAGNOSIS — I2510 Atherosclerotic heart disease of native coronary artery without angina pectoris: Secondary | ICD-10-CM | POA: Diagnosis not present

## 2020-12-01 DIAGNOSIS — R911 Solitary pulmonary nodule: Secondary | ICD-10-CM | POA: Diagnosis not present

## 2020-12-01 DIAGNOSIS — R918 Other nonspecific abnormal finding of lung field: Secondary | ICD-10-CM | POA: Diagnosis not present

## 2020-12-01 DIAGNOSIS — C220 Liver cell carcinoma: Secondary | ICD-10-CM | POA: Diagnosis not present

## 2020-12-08 DIAGNOSIS — C220 Liver cell carcinoma: Secondary | ICD-10-CM | POA: Diagnosis not present

## 2020-12-09 DIAGNOSIS — M545 Low back pain, unspecified: Secondary | ICD-10-CM | POA: Diagnosis not present

## 2020-12-09 DIAGNOSIS — C220 Liver cell carcinoma: Secondary | ICD-10-CM | POA: Diagnosis not present

## 2020-12-22 DIAGNOSIS — C220 Liver cell carcinoma: Secondary | ICD-10-CM | POA: Diagnosis not present

## 2020-12-26 DIAGNOSIS — C220 Liver cell carcinoma: Secondary | ICD-10-CM | POA: Diagnosis not present

## 2021-01-18 DIAGNOSIS — C220 Liver cell carcinoma: Secondary | ICD-10-CM | POA: Diagnosis not present

## 2021-01-25 DIAGNOSIS — J449 Chronic obstructive pulmonary disease, unspecified: Secondary | ICD-10-CM | POA: Diagnosis not present

## 2021-01-25 DIAGNOSIS — I10 Essential (primary) hypertension: Secondary | ICD-10-CM | POA: Diagnosis not present

## 2021-01-25 DIAGNOSIS — Z6823 Body mass index (BMI) 23.0-23.9, adult: Secondary | ICD-10-CM | POA: Diagnosis not present

## 2021-01-25 DIAGNOSIS — E119 Type 2 diabetes mellitus without complications: Secondary | ICD-10-CM | POA: Diagnosis not present

## 2021-01-25 DIAGNOSIS — G8929 Other chronic pain: Secondary | ICD-10-CM | POA: Diagnosis not present

## 2021-01-25 DIAGNOSIS — G4700 Insomnia, unspecified: Secondary | ICD-10-CM | POA: Diagnosis not present

## 2021-01-25 DIAGNOSIS — E785 Hyperlipidemia, unspecified: Secondary | ICD-10-CM | POA: Diagnosis not present

## 2021-01-30 DIAGNOSIS — C220 Liver cell carcinoma: Secondary | ICD-10-CM | POA: Diagnosis not present

## 2021-02-15 DIAGNOSIS — C220 Liver cell carcinoma: Secondary | ICD-10-CM | POA: Diagnosis not present

## 2021-03-01 DIAGNOSIS — C220 Liver cell carcinoma: Secondary | ICD-10-CM | POA: Diagnosis not present

## 2021-03-22 DIAGNOSIS — R160 Hepatomegaly, not elsewhere classified: Secondary | ICD-10-CM | POA: Diagnosis not present

## 2021-03-22 DIAGNOSIS — K8020 Calculus of gallbladder without cholecystitis without obstruction: Secondary | ICD-10-CM | POA: Diagnosis not present

## 2021-03-22 DIAGNOSIS — R918 Other nonspecific abnormal finding of lung field: Secondary | ICD-10-CM | POA: Diagnosis not present

## 2021-03-22 DIAGNOSIS — I700 Atherosclerosis of aorta: Secondary | ICD-10-CM | POA: Diagnosis not present

## 2021-03-22 DIAGNOSIS — K7460 Unspecified cirrhosis of liver: Secondary | ICD-10-CM | POA: Diagnosis not present

## 2021-03-22 DIAGNOSIS — I2510 Atherosclerotic heart disease of native coronary artery without angina pectoris: Secondary | ICD-10-CM | POA: Diagnosis not present

## 2021-03-22 DIAGNOSIS — N281 Cyst of kidney, acquired: Secondary | ICD-10-CM | POA: Diagnosis not present

## 2021-03-22 DIAGNOSIS — K5730 Diverticulosis of large intestine without perforation or abscess without bleeding: Secondary | ICD-10-CM | POA: Diagnosis not present

## 2021-03-22 DIAGNOSIS — J929 Pleural plaque without asbestos: Secondary | ICD-10-CM | POA: Diagnosis not present

## 2021-03-22 DIAGNOSIS — C220 Liver cell carcinoma: Secondary | ICD-10-CM | POA: Diagnosis not present

## 2021-04-03 DIAGNOSIS — I8500 Esophageal varices without bleeding: Secondary | ICD-10-CM | POA: Diagnosis not present

## 2021-04-03 DIAGNOSIS — K766 Portal hypertension: Secondary | ICD-10-CM | POA: Diagnosis not present

## 2021-04-03 DIAGNOSIS — C220 Liver cell carcinoma: Secondary | ICD-10-CM | POA: Diagnosis not present

## 2021-04-03 DIAGNOSIS — K7469 Other cirrhosis of liver: Secondary | ICD-10-CM | POA: Diagnosis not present

## 2021-04-03 DIAGNOSIS — K7682 Hepatic encephalopathy: Secondary | ICD-10-CM | POA: Diagnosis not present

## 2021-04-19 ENCOUNTER — Telehealth: Payer: Self-pay | Admitting: Oncology

## 2021-04-19 NOTE — Telephone Encounter (Signed)
Scheduled appt per 10/28 referral. Pt had requested to be seen in [place]. Pt is aware of appt date and time.

## 2021-04-28 ENCOUNTER — Inpatient Hospital Stay: Payer: Medicare Other | Attending: Oncology | Admitting: Oncology

## 2021-04-28 ENCOUNTER — Other Ambulatory Visit: Payer: Self-pay | Admitting: Oncology

## 2021-04-28 ENCOUNTER — Telehealth: Payer: Self-pay | Admitting: Oncology

## 2021-04-28 ENCOUNTER — Other Ambulatory Visit: Payer: Self-pay

## 2021-04-28 ENCOUNTER — Encounter: Payer: Self-pay | Admitting: Oncology

## 2021-04-28 ENCOUNTER — Inpatient Hospital Stay: Payer: Medicare Other

## 2021-04-28 DIAGNOSIS — Z79899 Other long term (current) drug therapy: Secondary | ICD-10-CM

## 2021-04-28 DIAGNOSIS — K7460 Unspecified cirrhosis of liver: Secondary | ICD-10-CM | POA: Insufficient documentation

## 2021-04-28 DIAGNOSIS — C220 Liver cell carcinoma: Secondary | ICD-10-CM | POA: Insufficient documentation

## 2021-04-28 DIAGNOSIS — F1021 Alcohol dependence, in remission: Secondary | ICD-10-CM | POA: Diagnosis not present

## 2021-04-28 DIAGNOSIS — Z87891 Personal history of nicotine dependence: Secondary | ICD-10-CM | POA: Diagnosis not present

## 2021-04-28 LAB — COMPREHENSIVE METABOLIC PANEL
Albumin: 3.9 (ref 3.5–5.0)
Calcium: 9 (ref 8.7–10.7)

## 2021-04-28 LAB — HEPATIC FUNCTION PANEL
ALT: 26 (ref 10–40)
AST: 42 — AB (ref 14–40)
Alkaline Phosphatase: 118 (ref 25–125)
Bilirubin, Total: 0.8

## 2021-04-28 LAB — BASIC METABOLIC PANEL
BUN: 19 (ref 4–21)
CO2: 25 — AB (ref 13–22)
Chloride: 108 (ref 99–108)
Creatinine: 1 (ref 0.6–1.3)
Glucose: 125
Potassium: 4.5 (ref 3.4–5.3)
Sodium: 140 (ref 137–147)

## 2021-04-28 LAB — CBC AND DIFFERENTIAL
HCT: 39 — AB (ref 41–53)
Hemoglobin: 13 — AB (ref 13.5–17.5)
Neutrophils Absolute: 2.08
Platelets: 112 — AB (ref 150–399)
WBC: 3.3

## 2021-04-28 LAB — CBC: RBC: 4.18 (ref 3.87–5.11)

## 2021-04-28 NOTE — Telephone Encounter (Signed)
Per 04/28/21 los next appt scheduled and confirmed with patient

## 2021-04-29 LAB — AFP TUMOR MARKER: AFP, Serum, Tumor Marker: 12.3 ng/mL — ABNORMAL HIGH (ref 0.0–8.4)

## 2021-04-30 DIAGNOSIS — C220 Liver cell carcinoma: Secondary | ICD-10-CM | POA: Insufficient documentation

## 2021-04-30 NOTE — Progress Notes (Signed)
[facility]  [address] [place],  [place]  [address] [phone]  Clinic Day:  04/28/2021  Referring physician: Earlyne Iba, NP   HISTORY OF PRESENT ILLNESS:  The patient is a 75 y.o. male  who I was asked to consult upon for hepatocellular carcinoma.  The patient has had cirrhosis since 2006.  Since then, the patient has been followed periodically with scans with respect to liver cancer surveillance.  In April 2022, an abdominal ultrasound revealed a left liver lesion. This was confirmed per a liver MRI, which showed the lesion measuring 2.9 cm.  It was qualified as being highly suspicious for hepatocellular carcinoma.  Based upon this finding, this gentleman eventually want sent to [facility].  He underwent Y-90 chemoembolization to this lesion in September 2022.  He comes in today as it was suggested that he also have someone more local to monitor his disease.  Overall, the patient is doing well.  He denies having right upper quadrant pain, nausea, jaundice, or other symptoms/findings which concern him for disease progression.    PAST MEDICAL HISTORY:   Past Medical History:  Diagnosis Date   Barrett's esophagus    CAD (coronary artery disease)    Cirrhosis (HCC)    COPD (chronic obstructive pulmonary disease) (HCC)    Esophageal varices ([place])    Heart attack ([place]) 2000   Hepatic encephalopathy ([place])    Hypercholesteremia    Hypertension     PAST SURGICAL HISTORY:   Past Surgical History:  Procedure Laterality Date   cad s/p angioplasty  2000   stent placement    CATARACT EXTRACTION     COLONOSCOPY  05/07/2002   Diminutive colonic polyp status post polypectomy. Mild diverticulosis. Internal hemorrhoids   ESOPHAGOGASTRODUODENOSCOPY  02/26/2013   Small esophageal varices (2 channels). Too small for endoscopic variceal ligation. Small fundal varices. Small hiatal hernia. Mild portal hypertensive gastropathy    left ing Hernia Repair  08/2018     CURRENT MEDICATIONS:   Current Outpatient Medications  Medication Sig Dispense Refill   carvedilol (COREG) 12.5 MG tablet Take 1 tablet (12.5 mg total) by mouth 2 (two) times daily. 60 tablet 11   enalapril (VASOTEC) 5 MG tablet Take 2.5 mg by mouth 2 (two) times daily.      furosemide (LASIX) 40 MG tablet Take 1 tablet (40 mg total) by mouth daily. 30 tablet 11   lactulose (CHRONULAC) 10 GM/15ML solution Take 30 ml by mouth twice daily. (Aim 2-3 Bowel movements a day) 1800 mL 11   Multiple Vitamin (MULTIVITAMIN) capsule Take 1 capsule by mouth daily.      nitroGLYCERIN (NITROSTAT) 0.4 MG SL tablet Place 1 tablet (0.4 mg total) under the tongue every 5 (five) minutes as needed. 25 tablet 3   Omega-3 1000 MG CAPS Take 3 capsules by mouth daily.      omeprazole (PRILOSEC) 20 MG capsule Take 1 capsule (20 mg total) by mouth daily. 30 capsule 11   potassium chloride SA (KLOR-CON) 20 MEQ tablet Take 1 tablet (20 mEq total) by mouth 2 (two) times daily. 60 tablet 11   temazepam (RESTORIL) 30 MG capsule Take 30 mg by mouth at bedtime as needed.     traMADol (ULTRAM) 50 MG tablet Take 50 mg by mouth every 6 (six) hours as needed.      No current facility-administered medications for this visit.    ALLERGIES:   Allergies  Allergen Reactions   Codeine  FAMILY HISTORY:   Family History  Problem Relation Age of Onset   Cancer Brother        mouth per patient    Colon cancer Neg Hx    Esophageal cancer Neg Hx     SOCIAL HISTORY:  The patient was born in [place].  He lives in [place] with his wife of 87 years.  They have 3 children and 1 grandchild.  He was a Geophysical data processor for numerous years.  He smoked as much as 3-4 packs of cigarettes daily for 40 years.  He consumed as many as 4 cases of beer and a half gallon of alcohol weekly for numerous years.    REVIEW OF SYSTEMS:  Review of Systems  Constitutional:  Negative for fatigue, fever and unexpected weight  change.  Respiratory:  Negative for chest tightness, cough, hemoptysis and shortness of breath.   Cardiovascular:  Negative for chest pain and palpitations.  Gastrointestinal:  Positive for diarrhea. Negative for abdominal distention, abdominal pain, blood in stool, constipation, nausea and vomiting.  Genitourinary:  Negative for dysuria, frequency and hematuria.   Musculoskeletal:  Positive for arthralgias. Negative for back pain and myalgias.  Skin:  Negative for itching and rash.  Neurological:  Negative for dizziness, headaches and light-headedness.  Psychiatric/Behavioral:  Negative for depression and suicidal ideas. The patient is not nervous/anxious.     PHYSICAL EXAM:  Blood pressure (!) 157/77, pulse 68, temperature 98.9 F (37.2 C), resp. rate 16, height 5\' 3"  (1.6 m), weight 149 lb 9.6 oz (67.9 kg), SpO2 93 %. Wt Readings from Last 3 Encounters:  04/28/21 149 lb 9.6 oz (67.9 kg)  02/25/20 143 lb 9.6 oz (65.1 kg)  06/23/19 133 lb (60.3 kg)   Body mass index is 26.5 kg/m. Performance status (ECOG): 1 - Symptomatic but completely ambulatory Physical Exam Constitutional:      Appearance: Normal appearance. He is not ill-appearing.  HENT:     Mouth/Throat:     Mouth: Mucous membranes are moist.     Pharynx: Oropharynx is clear. No oropharyngeal exudate or posterior oropharyngeal erythema.  Cardiovascular:     Rate and Rhythm: Normal rate and regular rhythm.     Heart sounds: No murmur heard.   No friction rub. No gallop.  Pulmonary:     Effort: Pulmonary effort is normal. No respiratory distress.     Breath sounds: Normal breath sounds. No wheezing, rhonchi or rales.  Abdominal:     General: Bowel sounds are normal. There is no distension.     Palpations: Abdomen is soft. There is no mass.     Tenderness: There is no abdominal tenderness.  Musculoskeletal:        General: No swelling.     Right lower leg: No edema.     Left lower leg: No edema.  Lymphadenopathy:      Cervical: No cervical adenopathy.     Upper Body:     Right upper body: No supraclavicular or axillary adenopathy.     Left upper body: No supraclavicular or axillary adenopathy.     Lower Body: No right inguinal adenopathy. No left inguinal adenopathy.  Skin:    General: Skin is warm.     Coloration: Skin is not jaundiced.     Findings: No lesion or rash.  Neurological:     General: No focal deficit present.     Mental Status: He is alert and oriented to person, place, and time. Mental status is at baseline.  Psychiatric:        Mood and Affect: Mood normal.        Behavior: Behavior normal.        Thought Content: Thought content normal.   LABS:   CBC Latest Ref Rng & Units 04/28/2021 06/05/2018 10/11/2017  WBC - 3.3 2.5(L) 2.0 Repeated and verified X2.(L)  Hemoglobin 13.5 - 17.5 13.0(A) 13.8 12.6(L)  Hematocrit 41 - 53 39(A) 41.1 37.6(L)  Platelets 150 - 399 112(A) 104.0(L) 97.0(L)   CMP Latest Ref Rng & Units 04/28/2021 06/05/2018 10/11/2017  Glucose 70 - 99 mg/dL - 112(H) 107(H)  BUN 4 - 21 19 20 18   Creatinine 0.6 - 1.3 1.0 1.14 0.99  Sodium 137 - 147 140 137 137  Potassium 3.4 - 5.3 4.5 4.3 4.2  Chloride 99 - 108 108 100 102  CO2 13 - 22 25(A) 27 28  Calcium 8.7 - 10.7 9.0 10.1 9.2  Total Protein 6.0 - 8.3 g/dL - 7.2 6.6  Total Bilirubin 0.2 - 1.2 mg/dL - 0.9 0.7  Alkaline Phos 25 - 125 118 57 62  AST 14 - 40 42(A) 27 26  ALT 10 - 40 26 21 21       Component Value Date/Time   AFPTUMOR 12.3 (H) 04/28/2021 1436    ASSESSMENT & PLAN:  A 75 y.o. 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  Based upon scans, this gentleman does not appear to have more diffuse disease.  There is no need to consider systemic therapy at this time.  The patient was not sure what his surveillance plan consists of, with respect to scans.  Usually, after Y-90 chemoembolization, scans are not done for at least another 2-3  months.  I will speak with his interventional radiologist at [facility] to determine what he wishes to do next with respect to his disease management/surveillance.  Otherwise, as he is clinically stable, I will see him back in 4 months for repeat clinical assessment. The patient understands all the plans discussed today and is in agreement with them.  I do appreciate Earlyne Iba, NP for his new consult.   Allee Busk Macarthur Critchley, MD

## 2021-05-04 ENCOUNTER — Other Ambulatory Visit: Payer: Self-pay | Admitting: Oncology

## 2021-05-04 DIAGNOSIS — C220 Liver cell carcinoma: Secondary | ICD-10-CM | POA: Diagnosis not present

## 2021-05-17 ENCOUNTER — Telehealth: Payer: Self-pay | Admitting: Gastroenterology

## 2021-05-17 DIAGNOSIS — E119 Type 2 diabetes mellitus without complications: Secondary | ICD-10-CM | POA: Diagnosis not present

## 2021-05-17 DIAGNOSIS — J449 Chronic obstructive pulmonary disease, unspecified: Secondary | ICD-10-CM | POA: Diagnosis not present

## 2021-05-17 DIAGNOSIS — E785 Hyperlipidemia, unspecified: Secondary | ICD-10-CM | POA: Diagnosis not present

## 2021-05-17 NOTE — Telephone Encounter (Signed)
Patient's wife called to see if Dr. Lyndel Safe has had a chance to complete patient's dental clearance form.  If the form is not done and returned by 12/7, they will lose their loan for him to get his teeth done.  Please call patient or his wife to let them know.  Thank you.

## 2021-05-17 NOTE — Telephone Encounter (Signed)
LVM on home. Work rang busy.  We haven't received any paper work on dental form.  Mychart message sent as well

## 2021-05-17 NOTE — Telephone Encounter (Signed)
Sorry...resending to the CMA.

## 2021-05-17 NOTE — Telephone Encounter (Signed)
Scheduler made him aware we don't have the forms.

## 2021-05-19 NOTE — Telephone Encounter (Signed)
LVM 2 times. We have the form and Dr Lyndel Safe has noted that to hold off on dental extractions unless it is really needed.no fax number provided

## 2021-05-19 NOTE — Telephone Encounter (Signed)
Patients wife returning your call about dental clearance. Please advise.

## 2021-05-22 NOTE — Telephone Encounter (Addendum)
Faxed form to number below. Faxed successfully

## 2021-05-22 NOTE — Telephone Encounter (Signed)
Inbound call from patient with fax number for dental form [phone]

## 2021-06-29 ENCOUNTER — Encounter: Payer: Self-pay | Admitting: Gastroenterology

## 2021-06-29 ENCOUNTER — Other Ambulatory Visit: Payer: Self-pay

## 2021-06-29 ENCOUNTER — Ambulatory Visit (INDEPENDENT_AMBULATORY_CARE_PROVIDER_SITE_OTHER): Payer: Medicare Other | Admitting: Gastroenterology

## 2021-06-29 ENCOUNTER — Other Ambulatory Visit (INDEPENDENT_AMBULATORY_CARE_PROVIDER_SITE_OTHER): Payer: Medicare Other

## 2021-06-29 VITALS — BP 130/78 | HR 58 | Ht 63.0 in | Wt 150.0 lb

## 2021-06-29 DIAGNOSIS — C220 Liver cell carcinoma: Secondary | ICD-10-CM

## 2021-06-29 NOTE — Patient Instructions (Signed)
If you are age 76 or older, your body mass index should be between 23-30. Your Body mass index is 26.57 kg/m. If this is out of the aforementioned range listed, please consider follow up with your Primary Care Provider.  If you are age 22 or younger, your body mass index should be between 19-25. Your Body mass index is 26.57 kg/m. If this is out of the aformentioned range listed, please consider follow up with your Primary Care Provider.   ________________________________________________________  The [facility] providers would like to encourage you to use [facility] to communicate with providers for non-urgent requests or questions.  Due to long hold times on the telephone, sending your provider a message by [facility] may be a faster and more efficient way to get a response.  Please allow 48 business hours for a response.  Please remember that this is for non-urgent requests.  _______________________________________________________  Please go to the lab on the 2nd floor [address] before you leave the office today.   Please call when you get home to give [place] an update of the medications you are taking so we can let Dr Lyndel Safe know your current medications   Continue salt and fluid restricted diet  Please call in 6 months to schedule follow up appointment  You have been scheduled for an MRI at [facility]  on 07-05-2021. Your appointment time is 9am. Please arrive to admitting (at main entrance of the hospital) 30 minutes prior to your appointment time for registration purposes. Please make certain not to have anything to eat or drink 6 hours prior to your test. In addition, if you have any metal in your body, have a pacemaker or defibrillator, please be sure to let your ordering physician know. This test typically takes 45 minutes to 1 hour to complete. Should you need to reschedule, please call [phone] to do so.   Thank you,  Dr. Jackquline Denmark

## 2021-06-29 NOTE — Progress Notes (Signed)
IMPRESSION and PLAN:    #1. [facility] Stage II s/p Y90 embolization 02/15/2021. Not a candidate for liver transplant d/t age, multiple comorbidities.  #2. Alcoholic Liver Cirrhosis with portal hypertension (off ETOH since april 2006). Liver Bx 01/2005-grade 3, stage IV cirrhosis.  Complicated by portal hypertension (splenomegaly, thrombocytopenia/pancytopenia, esophageal varices with history of bleeding status post EVL, ascites and hepatic encephalopathy). MELD-Na: 11 (12/2016)  #3.  EV s/p EVL 2007, Most recent EGD 02/2013-small esophageal varices, too small for EVL, small fundal varices, small hiatal hernia, mild portal hypertensive gastropathy, short segment Barrett's esophagus..   #4.  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 Loletha [facility] CT was done for [facility] screening).  Patient does not want to pursue any further work-up/treatment.     Plan: -CBC, CMP, AFP, PT INR -MRI liver with/without contrast. -Continue current meds.  He will call us when he gets home to update [place] with his meds. -Has FU appt with [facility] in April 2023. -Continue salt and fluid restricted diet. -FU in 6 months.    HPI:    Chief Complaint:   76yr old WM With [facility] s/p Y90 embolization 02/15/2021 Underlying ETOH liver cirrhosis with portal hypertension [facility]  Not a candidate for liver transplant d/t age, multiple comorbidities.  No nausea, vomiting, heartburn, regurgitation, odynophagia or dysphagia.  No constipation.  No melena or hematochezia. No unintentional weight loss. No abdominal pain.  Has been taking lactulose every day with resultant 2-3 soft BMs per day. Still has occ " brain fog".  Not sure if he is taking rifaximin.  He did not bring his medications along.  He is also taking Coreg but has reduced it to once a day.    ONCOLOGY HISTORY  HCC, stage II 11/19/20 MRI abdomen shows exophytic segment 2 lesion measuring  up to 2.4 cm with peripheral arterial hyperenhancement, probable washout on delayed images, and enhancing capsule. LR-5 12/01/20 CT chest shows Clustered nodules of the left lower lobe, likely infectious or inflammatory, but no evidence of metastatic disease.  12/09/20 Bone scan whole body with no definite scintigraphic evidence of osseous metastatic disease  11/30/20 AFP 12.8 05/04/21 AFP stable.  Review of systems:       Past Medical History:  Diagnosis Date   Barrett's esophagus    CAD (coronary artery disease)    Cirrhosis (HCC)    COPD (chronic obstructive pulmonary disease) (HCC)    Esophageal varices (HCC)    Heart attack ([facility]) 2000   Hepatic encephalopathy    Hypercholesteremia    Hypertension     Current Outpatient Medications  Medication Sig Dispense Refill   carvedilol (COREG) 12.5 MG tablet Take 1 tablet (12.5 mg total) by mouth 2 (two) times daily. 60 tablet 11   enalapril (VASOTEC) 5 MG tablet Take 2.5 mg by mouth 2 (two) times daily.      furosemide (LASIX) 40 MG tablet Take 1 tablet (40 mg total) by mouth daily. 30 tablet 11   lactulose (CHRONULAC) 10 GM/15ML solution Take 30 ml by mouth twice daily. (Aim 2-3 Bowel movements a day) 1800 mL 11   Multiple Vitamin (MULTIVITAMIN) capsule Take 1 capsule by mouth daily.      nitroGLYCERIN (NITROSTAT) 0.4 MG SL tablet Place 1 tablet (0.4 mg total) under the tongue every 5 (five) minutes as needed. 25 tablet 3   Omega-3 1000 MG CAPS Take 3 capsules by mouth daily.  omeprazole (PRILOSEC) 20 MG capsule Take 1 capsule (20 mg total) by mouth daily. 30 capsule 11   potassium chloride SA (KLOR-CON) 20 MEQ tablet Take 1 tablet (20 mEq total) by mouth 2 (two) times daily. 60 tablet 11   temazepam (RESTORIL) 30 MG capsule Take 30 mg by mouth at bedtime as needed.     traMADol (ULTRAM) 50 MG tablet Take 50 mg by mouth every 6 (six) hours as needed.      No current facility-administered medications for this visit.    Family  History  Problem Relation Age of Onset   Cancer Brother        mouth per patient    Colon cancer Neg Hx    Esophageal cancer Neg Hx     Social History   Tobacco Use   Smoking status: Former   Smokeless tobacco: Former   Tobacco comments:    quit smoking in 2000   Vaping Use   Vaping Use: Never used  Substance Use Topics   Alcohol use: Not Currently    Comment: quit in 2006   Drug use: Not Currently    Allergies  Allergen Reactions   Codeine      Review of Systems: All systems reviewed and negative except where noted in HPI.    Physical Exam:     BP 130/78    Pulse (!) 58    Ht 5\' 3"  (1.6 m)    Wt 150 lb (68 kg)    SpO2 98%    BMI 26.57 kg/m   GENERAL:  Alert, oriented, cooperative, not in acute distress. PSYCH: :Pleasant, normal mood and affect. HEENT:  conjunctiva pink, mucous membranes moist, neck supple without masses. No jaundice. CARDIAC:  S1 S2 normal. No murmers. PULM: Normal respiratory effort, lungs CTA bilaterally, no wheezing. ABDOMEN: Inspection: No visible peristalsis, no abnormal pulsations, skin normal.  Palpation/percussion: Soft, nontender, nondistended, no rigidity, no abnormal dullness to percussion, no hepatosplenomegaly and no palpable abdominal masses.  Auscultation: Normal bowel sounds, no abdominal bruits. Rectal exam: Deferred SKIN:  turgor, no lesions seen. Musculoskeletal:  Normal muscle tone, normal strength. NEURO: Alert and oriented x 3, no focal neurologic deficits. CBC Latest Ref Rng & Units 04/28/2021 06/05/2018 10/11/2017  WBC - 3.3 2.5(L) 2.0 Repeated and verified X2.(L)  Hemoglobin 13.5 - 17.5 13.0(A) 13.8 12.6(L)  Hematocrit 41 - 53 39(A) 41.1 37.6(L)  Platelets 150 - 399 112(A) 104.0(L) 97.0(L)   CMP Latest Ref Rng & Units 04/28/2021 06/05/2018 10/11/2017  Glucose 70 - 99 mg/dL - 112(H) 107(H)  BUN 4 - 21 19 20 18   Creatinine 0.6 - 1.3 1.0 1.14 0.99  Sodium 137 - 147 140 137 137  Potassium 3.4 - 5.3 4.5 4.3 4.2  Chloride  99 - 108 108 100 102  CO2 13 - 22 25(A) 27 28  Calcium 8.7 - 10.7 9.0 10.1 9.2  Total Protein 6.0 - 8.3 g/dL - 7.2 6.6  Total Bilirubin 0.2 - 1.2 mg/dL - 0.9 0.7  Alkaline Phos 25 - 125 118 57 62  AST 14 - 40 42(A) 27 26  ALT 10 - 40 26 21 21         Curtis Gil,MD 06/29/2021, 1:47 PM   CC [facility] Del[facility] Bali

## 2021-06-30 ENCOUNTER — Telehealth: Payer: Self-pay

## 2021-06-30 LAB — CBC WITH DIFFERENTIAL/PLATELET
Basophils Absolute: 0 10*3/uL (ref 0.0–0.1)
Basophils Relative: 1.1 % (ref 0.0–3.0)
Eosinophils Absolute: 0 10*3/uL (ref 0.0–0.7)
Eosinophils Relative: 2 % (ref 0.0–5.0)
HCT: 37.6 % — ABNORMAL LOW (ref 39.0–52.0)
Hemoglobin: 12.3 g/dL — ABNORMAL LOW (ref 13.0–17.0)
Lymphocytes Relative: 28.1 % (ref 12.0–46.0)
Lymphs Abs: 0.6 10*3/uL — ABNORMAL LOW (ref 0.7–4.0)
MCHC: 32.6 g/dL (ref 30.0–36.0)
MCV: 95 fl (ref 78.0–100.0)
Monocytes Absolute: 0.4 10*3/uL (ref 0.1–1.0)
Monocytes Relative: 18.9 % — ABNORMAL HIGH (ref 3.0–12.0)
Neutro Abs: 1.1 10*3/uL — ABNORMAL LOW (ref 1.4–7.7)
Neutrophils Relative %: 49.9 % (ref 43.0–77.0)
Platelets: 93 10*3/uL — ABNORMAL LOW (ref 150.0–400.0)
RBC: 3.96 Mil/uL — ABNORMAL LOW (ref 4.22–5.81)
RDW: 17.4 % — ABNORMAL HIGH (ref 11.5–15.5)
WBC: 2.2 10*3/uL — ABNORMAL LOW (ref 4.0–10.5)

## 2021-06-30 LAB — COMPREHENSIVE METABOLIC PANEL
ALT: 23 U/L (ref 0–53)
AST: 27 U/L (ref 0–37)
Albumin: 3.9 g/dL (ref 3.5–5.2)
Alkaline Phosphatase: 74 U/L (ref 39–117)
BUN: 13 mg/dL (ref 6–23)
CO2: 26 mEq/L (ref 19–32)
Calcium: 8.8 mg/dL (ref 8.4–10.5)
Chloride: 103 mEq/L (ref 96–112)
Creatinine, Ser: 0.88 mg/dL (ref 0.40–1.50)
GFR: 83.9 mL/min (ref >60.00)
Glucose, Bld: 96 mg/dL (ref 70–99)
Potassium: 4.6 mEq/L (ref 3.5–5.1)
Sodium: 137 mEq/L (ref 135–145)
Total Bilirubin: 0.6 mg/dL (ref 0.2–1.2)
Total Protein: 6.5 g/dL (ref 6.0–8.3)

## 2021-06-30 LAB — AFP TUMOR MARKER: AFP-Tumor Marker: 7.1 ng/mL — ABNORMAL HIGH (ref <6.1)

## 2021-06-30 LAB — PROTIME-INR
INR: 1.2 ratio — ABNORMAL HIGH (ref 0.8–1.0)
Prothrombin Time: 12.8 s (ref 9.6–13.1)

## 2021-06-30 NOTE — Telephone Encounter (Signed)
Pt stated that Dr. Lyndel Safe wanted him to call in to the office and give a list of the Medications that he was taking: 1: Coreg 12.5 mg ONCE daily       Takes only once daily where as prescription states twice daily. Pt states his BP was to low 2: Vasotec 5 mg ONCE daily         Takes only once daily where as prescription states twice daily. Pt states his BP was to low 3. Lasix 40 mg daily 4. Potassium Chloride SA 20 MEQ 2 times daily 5: Omeprazole 20 mg daily 6: TraMADol 50 mg q 6hr PRN  7: Lactulose 30 ML 2 times daily 8: Multivitamin daily 9: Omega 3 ( 3 capsules daily) 10: NitroGlycerin 0.4 mg sl every 5 minutes PRN

## 2021-07-02 NOTE — Telephone Encounter (Signed)
Add rifaximin 550 mg p.o. twice daily indefinitely.  Please set him up for patient assistance program RG

## 2021-07-03 ENCOUNTER — Telehealth: Payer: Self-pay | Admitting: Gastroenterology

## 2021-07-03 ENCOUNTER — Other Ambulatory Visit: Payer: Self-pay

## 2021-07-03 DIAGNOSIS — I8500 Esophageal varices without bleeding: Secondary | ICD-10-CM

## 2021-07-03 DIAGNOSIS — K7460 Unspecified cirrhosis of liver: Secondary | ICD-10-CM

## 2021-07-03 MED ORDER — RIFAXIMIN 550 MG PO TABS: 550.0000 mg | ORAL_TABLET | Freq: Two times a day (BID) | ORAL | 5 refills | Status: DC

## 2021-07-03 NOTE — Telephone Encounter (Signed)
Prescription sent to pharmacy: Pt wife made aware and  verbalized understanding with all questions answered.  Jerene Pitch can you please assist with   setting  him up for patient assistance program.

## 2021-07-03 NOTE — Telephone Encounter (Signed)
Patients wife stated that [facility] said they can not get patients Xifaxan. Patients wife is requesting that it be sent to [facility]  [place] [id number]  [phone]

## 2021-07-03 NOTE — Telephone Encounter (Signed)
Prescription sent to [facility]  [address] Pt wife Hattie Made aware Georga Kaufmann made aware that we are working on assistance for this medication. Hattie Verbalized understanding with all questions answered.

## 2021-07-05 ENCOUNTER — Other Ambulatory Visit: Payer: Self-pay

## 2021-07-05 ENCOUNTER — Ambulatory Visit (HOSPITAL_COMMUNITY)
Admission: RE | Admit: 2021-07-05 | Discharge: 2021-07-05 | Disposition: A | Discharge status: Home / Self Care | Payer: Medicare Other | Source: Ambulatory Visit | Attending: Gastroenterology | Admitting: Gastroenterology

## 2021-07-05 ENCOUNTER — Ambulatory Visit (HOSPITAL_COMMUNITY): Payer: Medicare Other

## 2021-07-05 DIAGNOSIS — C220 Liver cell carcinoma: Secondary | ICD-10-CM | POA: Insufficient documentation

## 2021-07-05 IMAGING — MR MR ABDOMEN WO/W CM
19 series · 48 of 48 positions shown · IV contrast (7ml GADAVIST)
Comparison: Multiple exams, including CT of [DATE] and MRI
[DATE]

CLINICAL DATA: Personal history of hepatocellular carcinoma

EXAM:
MRI ABDOMEN WITHOUT AND WITH CONTRAST
TECHNIQUE: Multiplanar multisequence MR imaging of the abdomen was performed
both before and after the administration of intravenous contrast.
CONTRAST:  7mL GADAVIST GADOBUTROL 1 MMOL/ML IV SOLN

[Series 3: cor haste · coronal · 6.0mm · 1.56mm/px · 1 of 36 slices shown]
[im 1/36]
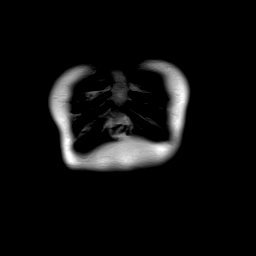

[Series 4: DWI · axial · 6.0mm · 1.68mm/px · z∈[-191,+83]mm · 3 of 78 slices shown (1 of 2)]
[im 1/78]
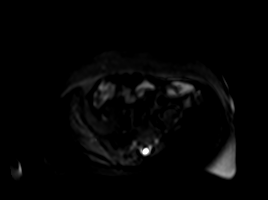
[im 39/78]
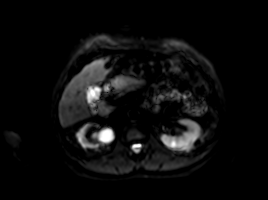
[im 78/78]
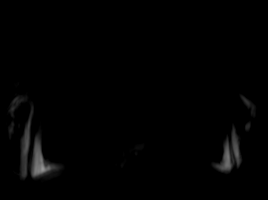

[Series 5: DWI · axial · 6.0mm · 1.68mm/px · z∈[-191,+83]mm · 2 of 39 slices shown (2 of 2)]
[im 1/39]
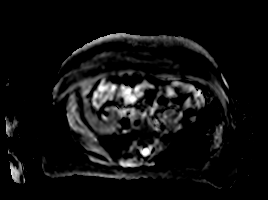
[im 39/39]
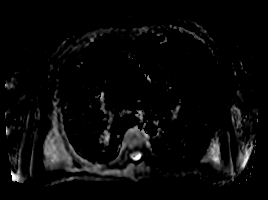

[Series 6: T2 fat-sat · 1 of 4 slices shown (1 of 2)]
[im 1/4]
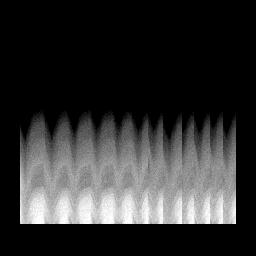

[Series 7: T2 fat-sat · axial · 6.0mm · 1.25mm/px · 1 of 32 slices shown (2 of 2)]
[im 1/32]
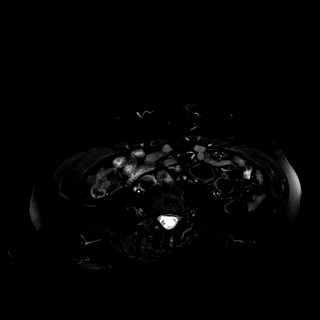

[Series 8: bSSFP · axial · 6.0mm · 2.34mm/px · z∈[-140,+76]mm · 2 of 37 slices shown]
[im 1/37]
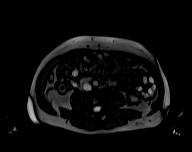
[im 37/37]
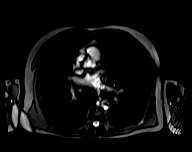

[Series 9: T1 dynamic · axial · 3.0mm · 1.41mm/px · z∈[-139,+74]mm · 3 of 72 slices shown (1 of 9)]
[im 1/72]
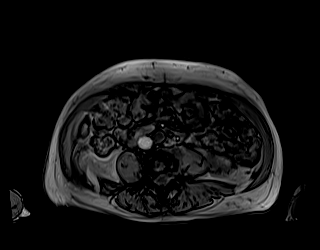
[im 36/72]
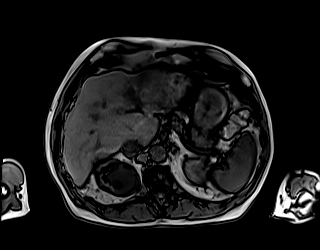
[im 72/72]
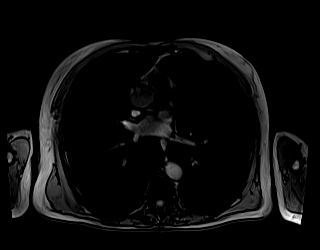

[Series 10: T1 dynamic · axial · 3.0mm · 1.41mm/px · z∈[-139,+74]mm · 3 of 72 slices shown (2 of 9)]
[im 1/72]
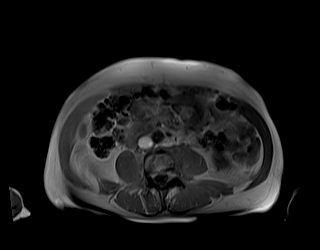
[im 36/72]
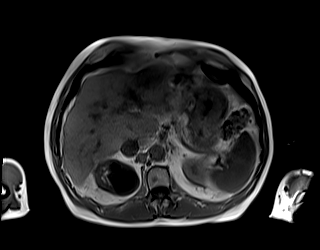
[im 72/72]
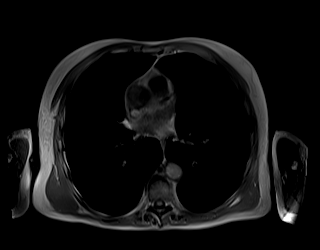

[Series 17: T1 dynamic · axial · 3.0mm · 1.41mm/px · z∈[-139,+74]mm · 3 of 72 slices shown (3 of 9)]
[im 1/72]
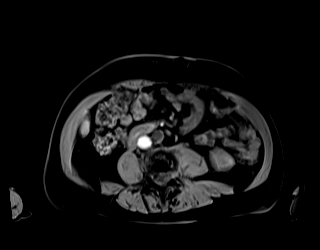
[im 36/72]
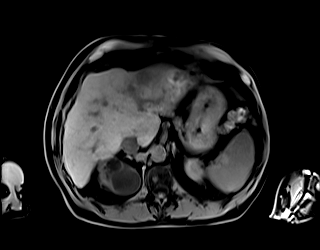
[im 72/72]
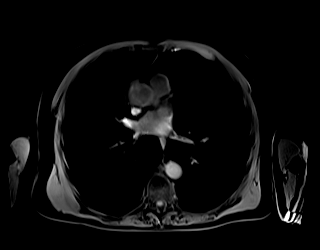

[Series 19: T1 dynamic · axial · 3.0mm · 1.41mm/px · z∈[-139,+74]mm · 3 of 72 slices shown (4 of 9)]
[im 1/72]
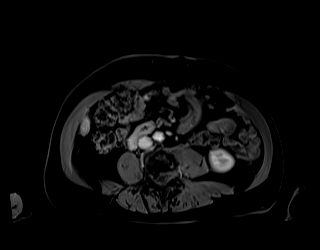
[im 36/72]
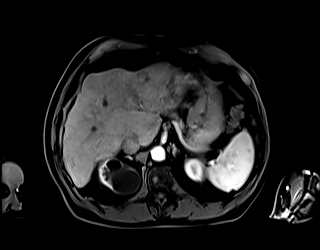
[im 72/72]
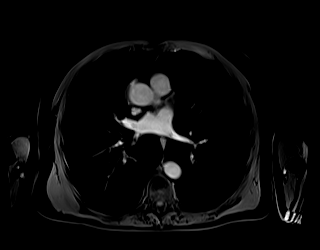

[Series 20: T1 dynamic · axial · 3.0mm · 1.41mm/px · z∈[-139,+74]mm · 3 of 72 slices shown (5 of 9)]
[im 1/72]
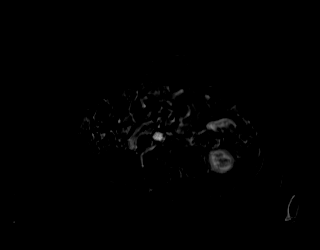
[im 36/72]
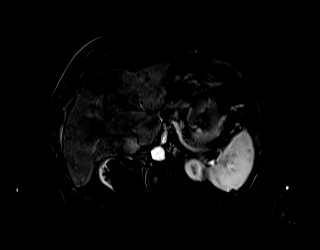
[im 72/72]
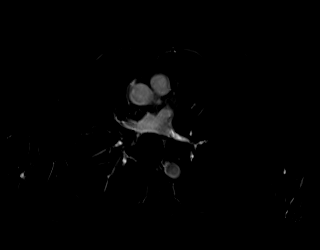

[Series 22: T1 dynamic · axial · 3.0mm · 1.41mm/px · z∈[-139,+74]mm · 3 of 72 slices shown (6 of 9)]
[im 1/72]
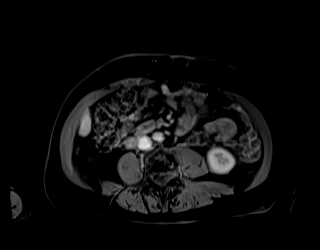
[im 36/72]
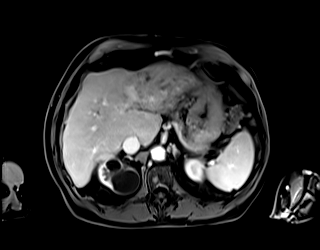
[im 72/72]
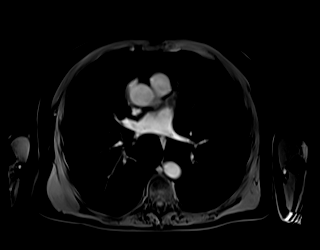

[Series 23: T1 dynamic · axial · 3.0mm · 1.41mm/px · z∈[-139,+74]mm · 3 of 72 slices shown (7 of 9)]
[im 1/72]
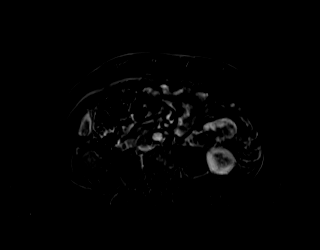
[im 36/72]
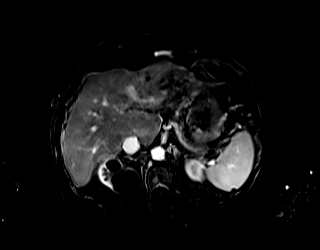
[im 72/72]
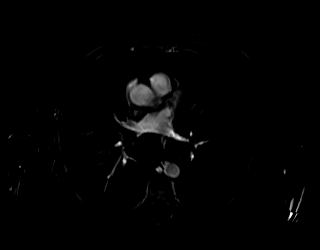

[Series 25: T1 dynamic · axial · 3.0mm · 1.41mm/px · z∈[-139,+74]mm · 3 of 72 slices shown (8 of 9)]
[im 1/72]
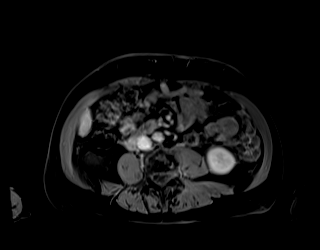
[im 36/72]
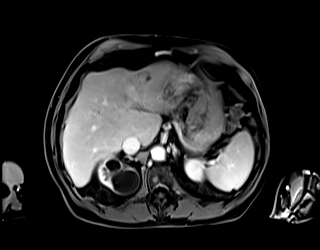
[im 72/72]
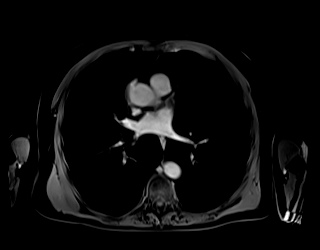

[Series 26: T1 dynamic · axial · 3.0mm · 1.41mm/px · z∈[-139,+74]mm · 3 of 72 slices shown (9 of 9)]
[im 1/72]
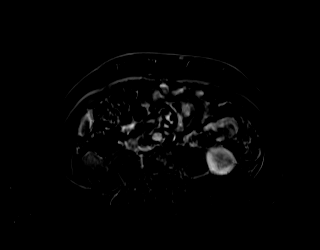
[im 36/72]
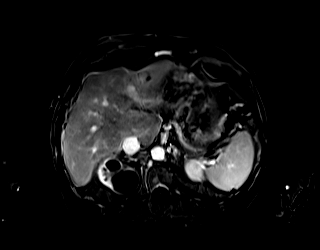
[im 72/72]
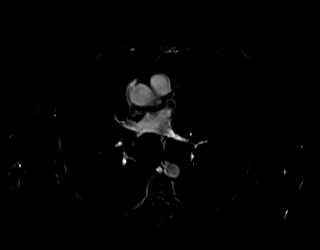

[Series 27: ax_haste_mbh · axial · 6.0mm · 1.41mm/px · 1 of 30 slices shown]
[im 1/30]
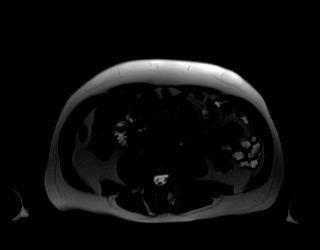

[Series 29: cor_vibe_dixon_delayed_w · coronal · 3.0mm · 2.01mm/px · 4 of 104 slices shown]
[im 1/104]
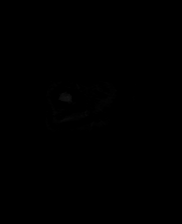
[im 35/104]
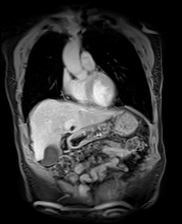
[im 69/104]
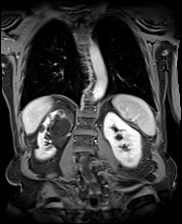
[im 104/104]
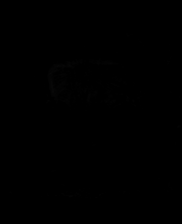

[Series 31: ax_dixon_delayed_w_reg · axial · 3.0mm · 1.41mm/px · z∈[-139,+74]mm · 3 of 72 slices shown]
[im 1/72]
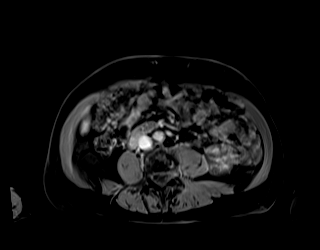
[im 36/72]
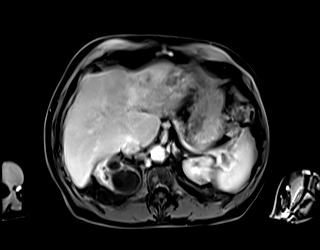
[im 72/72]
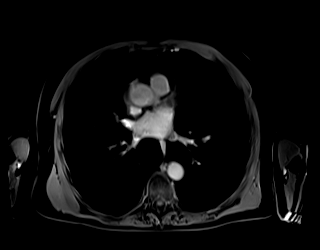

[Series 32: ax_dixon_delayed_w_reg_sub · axial · 3.0mm · 1.41mm/px · z∈[-139,+74]mm · 3 of 72 slices shown]
[im 1/72]
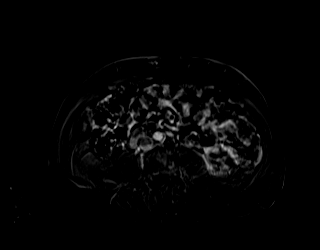
[im 36/72]
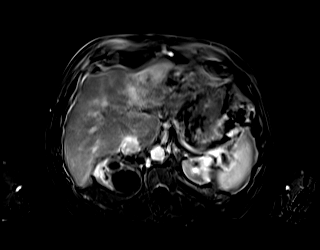
[im 72/72]
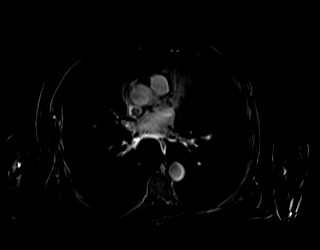

[48 of 48 positions shown; findings below may reference images not displayed]

FINDINGS: Despite efforts by the technologist and patient, motion artifact is
present on today's exam and could not be eliminated. This reduces
exam sensitivity and specificity.

Lower chest: Unremarkable

Hepatobiliary: 1.4 cm dependent gallstone in the gallbladder. No
biliary dilatation. Nodular hepatic contour compatible with
cirrhosis.

The lesion of concern in segment 2 of the liver anteriorly measures
2.6 by 2.0 by 1.6 cm on image 34 series 19. By my measurements, the
lesion was previously 3.2 by 2.7 by 2.2 cm on [DATE]. Currently
this lesion demonstrates arterial phase rim enhancement up to 3 mm
in thickness along with central hypoenhancement on arterial phase
images and all subsequent images. On the delayed images there may be
some hazy fill in of the lesion along with an oblique septation.
Current targetoid appearance compatible with LR-M designation. On
image 38 of series 25 there may be a small amount of thrombus in the
medially adjacent left portal venous branch. Likewise there is some
questionable hepatic vein thrombus posterior to the lesion for
example on image 33 series 26.

No additional specific lesions of intermediate or high suspicion for
hepatocellular carcinoma.

Pancreas:  Unremarkable

Spleen:  Unremarkable

Adrenals/Urinary Tract: Both adrenal glands appear normal. Bilateral
renal cysts including a Bosniak category 2 cyst of the left kidney
upper pole which contains 2-3 thin septations. Adrenal glands
unremarkable.

Stomach/Bowel: Unremarkable

Vascular/Lymphatic: Patent main portal vein. Recanalized umbilical
vein indicative of portal venous hypertension. Abdominal aortic
atherosclerotic calcification, there is likely some stenosis of the
proximal celiac artery.

Other:  No supplemental non-categorized findings.

Musculoskeletal:

Dextroconvex lumbar scoliosis with lumbar spondylosis and
degenerative disc disease. There also degenerative endplate findings
in the thoracic spine at T8-9.
IMPRESSION: 1. The lesion of concern in segment 2 of the liver is reduced in
size and has some changes in its enhancement characteristics,
currently demonstrating rim enhancement on all phases with some hazy
fill in on the delayed images, thus currently BILLIOT
appearance (malignancy but less specific for hepatocellular
carcinoma). Small foci of reduced signal along hepatic and portal
venous structures in the vicinity of this lesion raise the
possibility of mild adjacent portal vein and hepatic vein thrombosis
although the vessels are somewhat indistinct. No new worrisome
lesions are identified.
2. Hepatic cirrhosis with recanalized umbilical vein indicating
portal venous hypertension.
3. Cholelithiasis.
4. Lumbar spondylosis and scoliosis.

## 2021-07-05 MED ORDER — GADOBUTROL 1 MMOL/ML IV SOLN
7.0000 mL | Freq: Once | INTRAVENOUS | Status: AC | PRN
  Administered 2021-07-05: 7 mL via INTRAVENOUS

## 2021-07-05 NOTE — Telephone Encounter (Signed)
LVM again  Work number just keeps ringing

## 2021-07-05 NOTE — Telephone Encounter (Signed)
LVM for patient. Patient assistance is filled out. Patient needs to get the forms from the office or we can mail them. Will try again

## 2021-07-06 NOTE — Telephone Encounter (Signed)
LVM and mychart message sent.

## 2021-07-07 ENCOUNTER — Encounter: Payer: Self-pay | Admitting: Gastroenterology

## 2021-07-17 NOTE — Telephone Encounter (Signed)
Sent patient assistance program

## 2021-08-08 ENCOUNTER — Other Ambulatory Visit: Payer: Self-pay | Admitting: Gastroenterology

## 2021-08-21 NOTE — Progress Notes (Incomplete)
?[facility]  ?[address] ?[place],  [place]  [address] ?[phone] ? ?Clinic Day:  08/25/2021 ? ?Referring physician: Barnetta Chapel, NP ? ?This document serves as a record of services personally performed by Marice Potter, MD. It was created on their behalf by Curry,Lauren E, a trained medical scribe. The creation of this record is based on the scribe's personal observations and the provider's statements to them. ? ?HISTORY OF PRESENT ILLNESS:  ?The patient is a 76 y.o. male  who I recently began seeing for hepatocellular carcinoma.  The patient has had cirrhosis since 2006.  Since then, the patient has been followed periodically with scans with respect to liver cancer surveillance.  In April 2022, an abdominal ultrasound revealed a left liver lesion. This was confirmed per a liver MRI, which showed the lesion measuring 2.9 cm.  It was qualified as being highly suspicious for hepatocellular carcinoma.  Based upon this finding, this gentleman eventually want sent to [facility].  He underwent Y-90 chemoembolization to this lesion in September 2022.  He comes in today as it was suggested that he also have someone more local to monitor his disease.  Overall, the patient is doing well.  He denies having right upper quadrant pain, nausea, jaundice, or other symptoms/findings which concern him for disease progression.   ? ?PHYSICAL EXAM:  ?There were no vitals taken for this visit. ?Wt Readings from Last 3 Encounters:  ?06/29/21 150 lb (68 kg)  ?04/28/21 149 lb 9.6 oz (67.9 kg)  ?02/25/20 143 lb 9.6 oz (65.1 kg)  ? ?There is no height or weight on file to calculate BMI. ?Performance status (ECOG): 1 - Symptomatic but completely ambulatory ?Physical Exam ?Constitutional:   ?   Appearance: Normal appearance. He is not ill-appearing.  ?HENT:  ?   Mouth/Throat:  ?   Mouth: Mucous membranes are moist.  ?   Pharynx: Oropharynx is clear. No oropharyngeal exudate or posterior oropharyngeal  erythema.  ?Cardiovascular:  ?   Rate and Rhythm: Normal rate and regular rhythm.  ?   Heart sounds: No murmur heard. ?  No friction rub. No gallop.  ?Pulmonary:  ?   Effort: Pulmonary effort is normal. No respiratory distress.  ?   Breath sounds: Normal breath sounds. No wheezing, rhonchi or rales.  ?Abdominal:  ?   General: Bowel sounds are normal. There is no distension.  ?   Palpations: Abdomen is soft. There is no mass.  ?   Tenderness: There is no abdominal tenderness.  ?Musculoskeletal:     ?   General: No swelling.  ?   Right lower leg: No edema.  ?   Left lower leg: No edema.  ?Lymphadenopathy:  ?   Cervical: No cervical adenopathy.  ?   Upper Body:  ?   Right upper body: No supraclavicular or axillary adenopathy.  ?   Left upper body: No supraclavicular or axillary adenopathy.  ?   Lower Body: No right inguinal adenopathy. No left inguinal adenopathy.  ?Skin: ?   General: Skin is warm.  ?   Coloration: Skin is not jaundiced.  ?   Findings: No lesion or rash.  ?Neurological:  ?   General: No focal deficit present.  ?   Mental Status: He is alert and oriented to person, place, and time. Mental status is at baseline.  ?Psychiatric:     ?   Mood and Affect: Mood normal.     ?   Behavior:  Behavior normal.     ?   Thought Content: Thought content normal.  ? ?LABS:  ? ?CBC Latest Ref Rng & Units 06/29/2021 04/28/2021 06/05/2018  ?WBC 4.0 - 10.5 K/uL 2.2 Repeated and verified X2.(L) 3.3 2.5(L)  ?Hemoglobin 13.0 - 17.0 g/dL 12.3(L) 13.0(A) 13.8  ?Hematocrit 39.0 - 52.0 % 37.6(L) 39(A) 41.1  ?Platelets 150.0 - 400.0 K/uL 93.0(L) 112(A) 104.0(L)  ? ?CMP Latest Ref Rng & Units 06/29/2021 04/28/2021 06/05/2018  ?Glucose 70 - 99 mg/dL 96 - 112(H)  ?BUN 6 - 23 mg/dL '13 19 20  '$ ?Creatinine 0.40 - 1.50 mg/dL 0.88 1.0 1.14  ?Sodium 135 - 145 mEq/L 137 140 137  ?Potassium 3.5 - 5.1 mEq/L 4.6 4.5 4.3  ?Chloride 96 - 112 mEq/L 103 108 100  ?CO2 19 - 32 mEq/L 26 25(A) 27  ?Calcium 8.4 - 10.5 mg/dL 8.8 9.0 10.1  ?Total Protein 6.0  - 8.3 g/dL 6.5 - 7.2  ?Total Bilirubin 0.2 - 1.2 mg/dL 0.6 - 0.9  ?Alkaline Phos 39 - 117 U/L 74 118 57  ?AST 0 - 37 U/L 27 42(A) 27  ?ALT 0 - 53 U/L '23 26 21  '$ ? ?   ?Component Value Date/Time  ? AFPTUMOR 12.3 (H) 04/28/2021 1436  ? ? ?ASSESSMENT & PLAN:  ?A 76 y.o. 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  Based upon scans, this gentleman does not appear to have more diffuse disease.  There is no need to consider systemic therapy at this time.  The patient was not sure what his surveillance plan consists of, with respect to scans.  Usually, after Y-90 chemoembolization, scans are not done for at least another 2-3 months.  I will speak with his interventional radiologist at [facility] to determine what he wishes to do next with respect to his disease management/surveillance.  Otherwise, as he is clinically stable, I will see him back in 4 months for repeat clinical assessment. The patient understands all the plans discussed today and is in agreement with them. ? ? ?I, Rita Ohara, am acting as scribe for Marice Potter, MD   ? ?I have reviewed this report as typed by the medical scribe, and it is complete and accurate. ? ?Dequincy Macarthur Critchley, MD ? ? ? ?  ? ?

## 2021-08-22 ENCOUNTER — Other Ambulatory Visit: Payer: Self-pay | Admitting: Gastroenterology

## 2021-08-24 ENCOUNTER — Other Ambulatory Visit: Payer: Self-pay

## 2021-08-24 MED ORDER — POTASSIUM CHLORIDE CRYS ER 20 MEQ PO TBCR: 20.0000 meq | EXTENDED_RELEASE_TABLET | Freq: Two times a day (BID) | ORAL | 11 refills | Status: DC

## 2021-08-25 ENCOUNTER — Ambulatory Visit: Payer: Medicare Other | Admitting: Oncology

## 2021-08-25 ENCOUNTER — Other Ambulatory Visit: Payer: Medicare Other

## 2021-08-30 NOTE — Progress Notes (Signed)
?[facility]  ?[address] ?[place],  [place]  [address] ?[phone] ? ?Clinic Day:  08/31/2021 ? ?Referring physician: Barnetta Chapel, NP ? ? ?HISTORY OF PRESENT ILLNESS:  ?The patient is a 76 y.o. male  with hepatocellular carcinoma.  The patient has had underlying cirrhosis since 2006.  He underwent Y-90 chemoembolization to this his one focus of [facility] in late August 2022.  He comes in today for routine follow-up.  Since his last visit, the patient has been doing fairly well.  Of note, he undergoes routine liver MRIs to follow his hepatocellular carcinoma.  His most recent study showed his segment to left hepatic lesion being essentially unchanged in size since his Y90 chemoembolization was given last year.  Overall, the patient is doing fairly well.  He continues to deny having right upper quadrant pain, nausea, jaundice, or other symptoms/findings which concern him for disease progression.   ? ?PHYSICAL EXAM:  ?Blood pressure (!) 158/74, pulse 64, temperature 97.6 ?F (36.4 ?C), resp. rate 16, height '5\' 3"'$  (1.6 m), weight 149 lb 6.4 oz (67.8 kg), SpO2 99 %. ?Wt Readings from Last 3 Encounters:  ?08/31/21 149 lb 6.4 oz (67.8 kg)  ?06/29/21 150 lb (68 kg)  ?04/28/21 149 lb 9.6 oz (67.9 kg)  ? ?Body mass index is 26.47 kg/m?Marland Kitchen ?Performance status (ECOG): 1 - Symptomatic but completely ambulatory ?Physical Exam ?Constitutional:   ?   Appearance: Normal appearance. He is not ill-appearing.  ?HENT:  ?   Mouth/Throat:  ?   Mouth: Mucous membranes are moist.  ?   Pharynx: Oropharynx is clear. No oropharyngeal exudate or posterior oropharyngeal erythema.  ?Cardiovascular:  ?   Rate and Rhythm: Normal rate and regular rhythm.  ?   Heart sounds: No murmur heard. ?  No friction rub. No gallop.  ?Pulmonary:  ?   Effort: Pulmonary effort is normal. No respiratory distress.  ?   Breath sounds: Normal breath sounds. No wheezing, rhonchi or rales.  ?Abdominal:  ?   General: Bowel sounds are  normal. There is no distension.  ?   Palpations: Abdomen is soft. There is no mass.  ?   Tenderness: There is no abdominal tenderness.  ?Musculoskeletal:     ?   General: No swelling.  ?   Right lower leg: No edema.  ?   Left lower leg: No edema.  ?Lymphadenopathy:  ?   Cervical: No cervical adenopathy.  ?   Upper Body:  ?   Right upper body: No supraclavicular or axillary adenopathy.  ?   Left upper body: No supraclavicular or axillary adenopathy.  ?   Lower Body: No right inguinal adenopathy. No left inguinal adenopathy.  ?Skin: ?   General: Skin is warm.  ?   Coloration: Skin is not jaundiced.  ?   Findings: No lesion or rash.  ?Neurological:  ?   General: No focal deficit present.  ?   Mental Status: He is alert and oriented to person, place, and time. Mental status is at baseline.  ?Psychiatric:     ?   Mood and Affect: Mood normal.     ?   Behavior: Behavior normal.     ?   Thought Content: Thought content normal.  ? ?SCANS:  His abdominal MRI in February 2023 revealed the following: ?FINDINGS:  ?- Lower Thorax: No pleural effusion.  ? ?- Liver: Nodular liver contour. Accessory left hepatic artery from the left  ?gastric artery. Recannulized umbilical  vein.  ? ?Observation 1: Exophytic segment 2 lesion measuring 2.6 cm with peripheral  ?arterial hyperenhancement and no definite washout. No diffusion signal at  ?this location. There is serpiginous enhancement which extends towards the  ?hilum with central hypointensity (19/32). LR-TR equivocal.  ? ?Extensive foci of peripheral arterial hyperenhancement are redemonstrated  ?without washout or abnormal T2 signal.  ? ?- Biliary and Gallbladder: No intra or extrahepatic biliary dilatation.  ?Gallstones.  ? ?- Pancreas: Normal in appearance.  ? ?- Spleen: Normal in appearance.    ? ?- Kidneys: Renal cysts.  No hydronephrosis.  ? ?- Adrenal Glands: Normal in appearance.  ? ?- Gastrointestinal Tract: No evidence of obstruction. Susceptibility  ?artifact in the distal  stomach is nonspecific, possibly air.  ? ?- Abdominal Vasculature: No abdominal aortic aneurysm.  ? ?- Peritoneum/Mesentery/Retroperitoneum: No abnormal fluid.  ? ?- Lymph Nodes: No lymphadenopathy.    ? ?- Body Wall: Unremarkable.  ? ?- Musculoskeletal:  No aggressive appearing osseous lesions.  ? ?IMPRESSION:  ? ?Segment 2 lesion no longer demonstrates internal enhancement but continues  ?to demonstrate peripheral rim enhancement. There is serpiginous enhancement  ?which extends towards the hilum with central hypointensity, favored  ?treatment related. Attention on follow-up. LR-TR equivocal.   ? ?LABS:  ? ? ?  Latest Ref Rng & Units 08/31/2021  ? 12:00 AM 06/29/2021  ?  2:30 PM 04/28/2021  ? 12:00 AM  ?CBC  ?WBC  2.7      2.2 Repeated and verified X2.   3.3    ?Hemoglobin 13.5 - 17.5 12.4      12.3   13.0    ?Hematocrit 41 - 53 38      37.6   39    ?Platelets 150 - 400 K/uL 104      93.0   112    ?  ? This result is from an external source.  ? ? ?  Latest Ref Rng & Units 08/31/2021  ? 12:00 AM 06/29/2021  ?  2:30 PM 04/28/2021  ? 12:00 AM  ?CMP  ?Glucose 70 - 99 mg/dL  96     ?BUN 4 - '21 13      13   19    '$ ?Creatinine 0.6 - 1.3 0.8      0.88   1.0    ?Sodium 137 - 147 130      137   140    ?Potassium 3.5 - 5.1 mEq/L 3.7      4.6   4.5    ?Chloride 99 - 108 106      103   108    ?CO2 13 - '22 26      26   25    '$ ?Calcium 8.7 - 10.7 9.1      8.8   9.0    ?Total Protein 6.0 - 8.3 g/dL  6.5     ?Total Bilirubin 0.2 - 1.2 mg/dL  0.6     ?Alkaline Phos 25 - 125 96      74   118    ?AST 14 - 40 37      27   42    ?ALT 10 - 40 U/L '31      23   26    '$ ?  ? This result is from an external source.  ? ?   ?Component Value Date/Time  ? AFPTUMOR 5.5 08/31/2021 1358  ? ? ?ASSESSMENT & PLAN:  ?A 76 y.o. male with  stage IB (T1b N0 M0) hepatocellular carcinoma, status post Y90 chemoembolization in late August 2022.  Based upon his labs, recent MRI, and physical exam, there is nothing to suggest his hepatocellular carcinoma is progressing  to where more immediate therapy needs to be considered.  As mentioned previously, this gentleman is receiving routine abdominal MRIs to ensure his hepatocellular carcinoma is not showing radiographic evidence of disease progression.  Clinically, he continues to do very well.  For now, his local disease management will continue to be spearheaded by interventional radiology/hepatology at [facility].  I will see him back in 6 months for repeat clinical assessment.  Per his request, he wishes to have his MRIs done locally.  I will arrange for his abdominal MRI to be done 1 day before his next visit for his continued [facility] radiographic [place] surveillance.  The patient knows to contact our office before his next visit if he has any new symptoms or findings which concern him for possible disease progression to where systemic therapy may need to be considered.   ? ?I do appreciate Barnetta Chapel, NP for his new consult.  ? ?Nivedita Mirabella Macarthur Critchley, MD   ? ? ?  ? ?

## 2021-08-31 ENCOUNTER — Other Ambulatory Visit: Payer: Self-pay | Admitting: Oncology

## 2021-08-31 ENCOUNTER — Other Ambulatory Visit: Payer: Self-pay

## 2021-08-31 ENCOUNTER — Inpatient Hospital Stay: Payer: Medicare Other | Attending: Oncology

## 2021-08-31 ENCOUNTER — Inpatient Hospital Stay (INDEPENDENT_AMBULATORY_CARE_PROVIDER_SITE_OTHER): Payer: Medicare Other | Admitting: Oncology

## 2021-08-31 VITALS — BP 158/74 | HR 64 | Temp 97.6°F | Resp 16 | Ht 63.0 in | Wt 149.4 lb

## 2021-08-31 DIAGNOSIS — C220 Liver cell carcinoma: Secondary | ICD-10-CM

## 2021-08-31 LAB — CBC AND DIFFERENTIAL
HCT: 38 — AB (ref 41–53)
Hemoglobin: 12.4 — AB (ref 13.5–17.5)
Neutrophils Absolute: 1.46
Platelets: 104 10*3/uL — AB (ref 150–400)
WBC: 2.7

## 2021-08-31 LAB — CBC: RBC: 3.97 (ref 3.87–5.11)

## 2021-08-31 LAB — BASIC METABOLIC PANEL
BUN: 13 (ref 4–21)
CO2: 26 — AB (ref 13–22)
Chloride: 106 (ref 99–108)
Creatinine: 0.8 (ref 0.6–1.3)
Glucose: 135
Potassium: 3.7 mEq/L (ref 3.5–5.1)
Sodium: 130 — AB (ref 137–147)

## 2021-08-31 LAB — HEPATIC FUNCTION PANEL
ALT: 31 U/L (ref 10–40)
AST: 37 (ref 14–40)
Alkaline Phosphatase: 96 (ref 25–125)
Bilirubin, Total: 0.9

## 2021-08-31 LAB — COMPREHENSIVE METABOLIC PANEL
Albumin: 3.8 (ref 3.5–5.0)
Calcium: 9.1 (ref 8.7–10.7)

## 2021-09-02 LAB — AFP TUMOR MARKER: AFP, Serum, Tumor Marker: 5.5 ng/mL (ref 0.0–8.4)

## 2021-10-05 ENCOUNTER — Other Ambulatory Visit: Payer: Self-pay | Admitting: Family Medicine

## 2021-10-11 ENCOUNTER — Telehealth: Payer: Self-pay

## 2021-10-11 DIAGNOSIS — C220 Liver cell carcinoma: Secondary | ICD-10-CM

## 2021-10-11 NOTE — Telephone Encounter (Signed)
Spoke with wife. Dr Loletha Grayer wanted pt to have chest CT done too. Per wife patient is getting that done today ?

## 2021-10-11 NOTE — Telephone Encounter (Signed)
Spoke to patient Monday. He said he was told he needed an MRI every 3 months per Dr Loletha Grayer. Told him I would check on this and get back with him Wednesday.  Order confirmed for MRI abd with and without contrast that Dr Loletha Grayer wrote on 4-24. Order placed and had office to fax over last OV note to have Dr Lyndel Safe review. Spoke to wife and was told that patient can call to have this scheduled. He will have contrast but its not oral so he doesn't have to pick any up before hand. She voiced understanding ?

## 2021-11-02 ENCOUNTER — Other Ambulatory Visit: Payer: Self-pay

## 2021-11-02 DIAGNOSIS — K7031 Alcoholic cirrhosis of liver with ascites: Secondary | ICD-10-CM

## 2021-11-02 DIAGNOSIS — K7682 Hepatic encephalopathy: Secondary | ICD-10-CM

## 2021-11-02 MED ORDER — LACTULOSE 10 GM/15ML PO SOLN: ORAL | 11 refills | Status: DC

## 2021-11-27 ENCOUNTER — Ambulatory Visit (HOSPITAL_COMMUNITY)
Admission: RE | Admit: 2021-11-27 | Discharge: 2021-11-27 | Disposition: A | Discharge status: Home / Self Care | Payer: Medicare Other | Source: Ambulatory Visit | Attending: Gastroenterology | Admitting: Gastroenterology

## 2021-11-27 DIAGNOSIS — C220 Liver cell carcinoma: Secondary | ICD-10-CM | POA: Diagnosis present

## 2021-11-27 IMAGING — MR MR ABDOMEN WO/W CM
19 series · 48 of 48 positions shown · IV contrast (gadavist)
Comparison: Abdominal MRI [DATE].

CLINICAL DATA: 76-year-old male with history of hepatocellular
carcinoma. Follow-up study.

EXAM:
MRI ABDOMEN WITHOUT AND WITH CONTRAST
TECHNIQUE: Multiplanar multisequence MR imaging of the abdomen was performed
both before and after the administration of intravenous contrast.
CONTRAST:  7.5mL GADAVIST GADOBUTROL 1 MMOL/ML IV SOLN

[Series 3: T2 · coronal · 6.0mm · 1.56mm/px · 2 of 30 slices shown (1 of 2)]
[im 1/30]
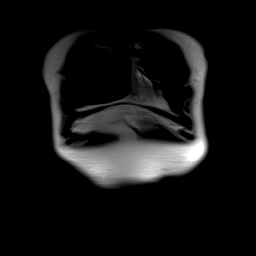
[im 30/30]
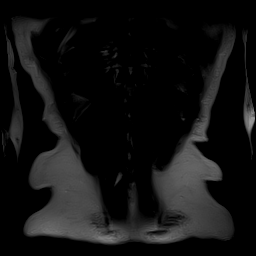

[Series 4: T2 fat-sat · 2 of 4 slices shown (1 of 2)]
[im 1/4]
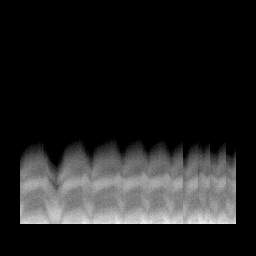
[im 4/4]
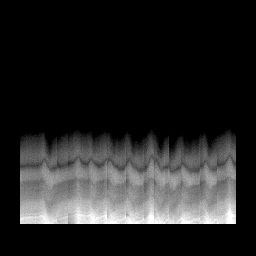

[Series 5: T2 fat-sat · axial · 6.0mm · 1.25mm/px · 1 of 36 slices shown (2 of 2)]
[im 1/36]
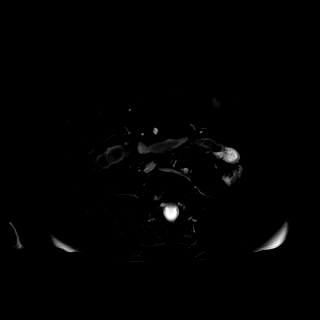

[Series 6: DWI · axial · 6.0mm · 1.49mm/px · z∈[-160,+92]mm · 3 of 72 slices shown (1 of 2)]
[im 1/72]
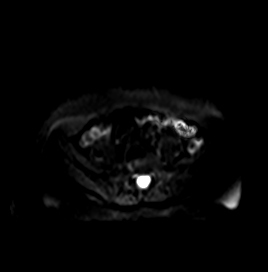
[im 36/72]
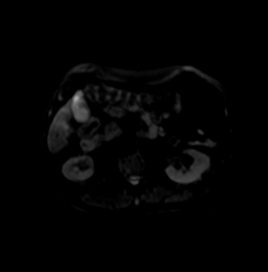
[im 72/72]
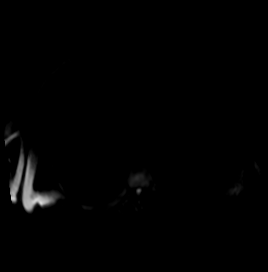

[Series 7: DWI · axial · 6.0mm · 1.49mm/px · 1 of 36 slices shown (2 of 2)]
[im 1/36]
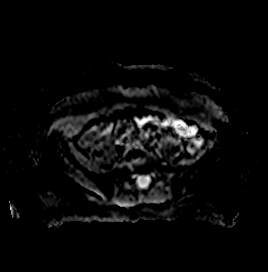

[Series 8: T1 · axial · 3.0mm · 1.25mm/px · z∈[-148,+65]mm · 3 of 72 slices shown (1 of 2)]
[im 1/72]
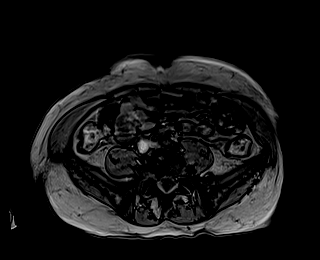
[im 36/72]
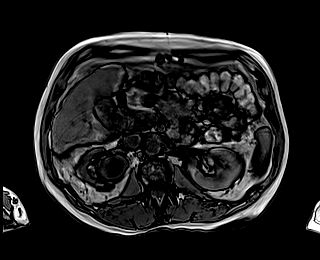
[im 72/72]
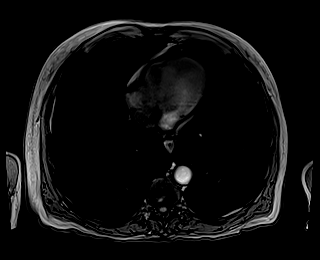

[Series 9: T1 · axial · 3.0mm · 1.25mm/px · z∈[-148,+65]mm · 3 of 72 slices shown (2 of 2)]
[im 1/72]
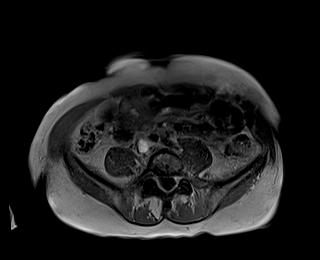
[im 36/72]
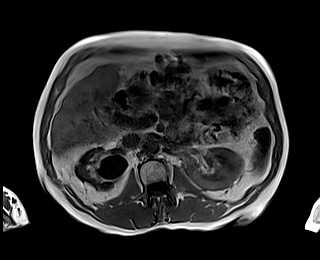
[im 72/72]
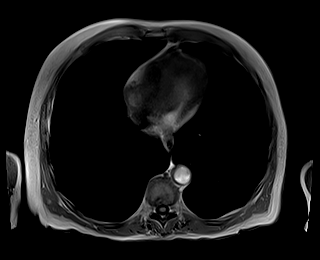

[Series 10: bSSFP · axial · 4.0mm · 0.84mm/px · z∈[-142,+58]mm · 2 of 51 slices shown]
[im 1/51]
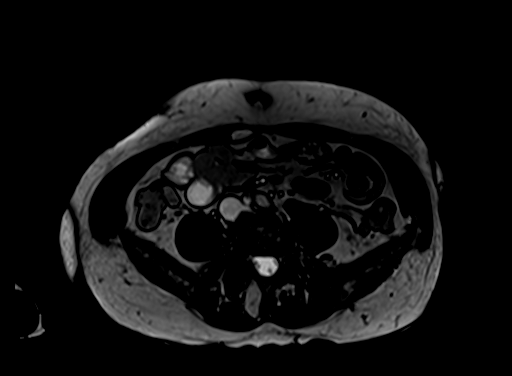
[im 51/51]
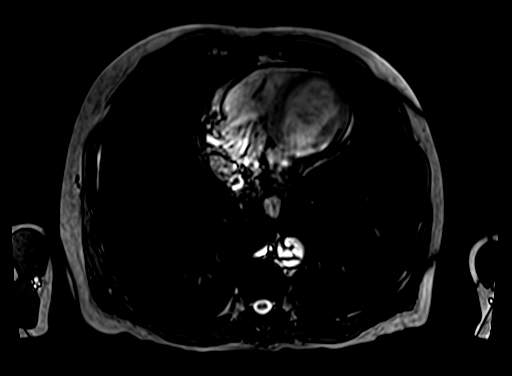

[Series 12: T1 dynamic · axial · 3.0mm · 1.25mm/px · z∈[-161,+76]mm · 3 of 80 slices shown (1 of 10)]
[im 1/80]
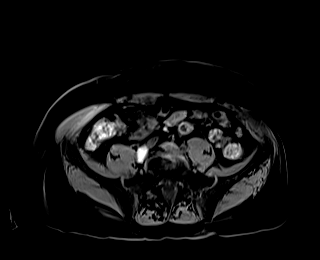
[im 40/80]
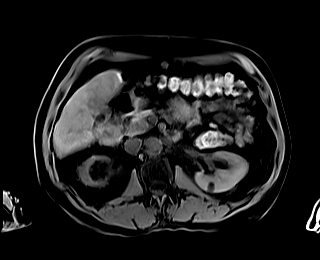
[im 80/80]
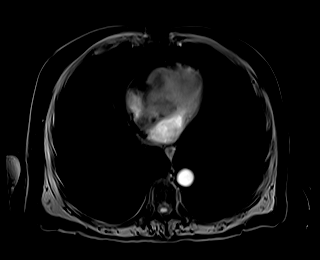

[Series 15: T1 dynamic · axial · 3.0mm · 1.25mm/px · z∈[-161,+76]mm · 3 of 80 slices shown (2 of 10)]
[im 1/80]
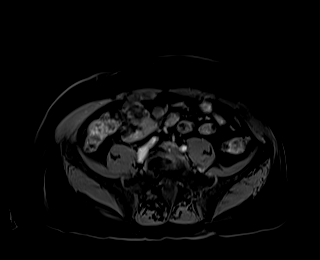
[im 40/80]
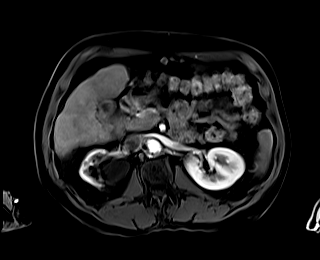
[im 80/80]
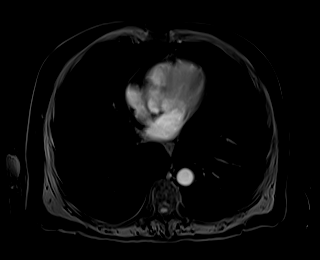

[Series 16: T1 dynamic · axial · 3.0mm · 1.25mm/px · z∈[-161,+76]mm · 3 of 80 slices shown (3 of 10)]
[im 1/80]
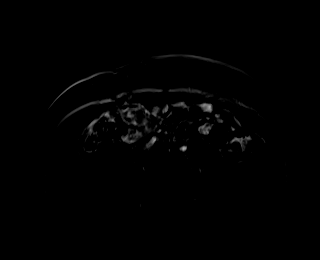
[im 40/80]
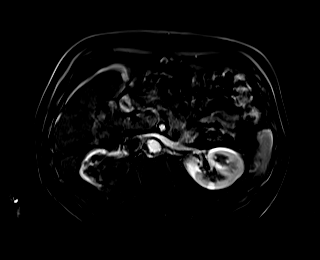
[im 80/80]
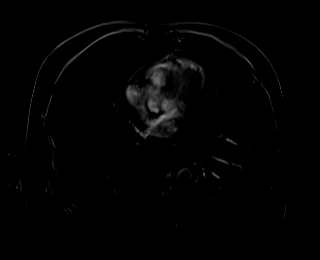

[Series 19: T1 dynamic · axial · 3.0mm · 1.25mm/px · z∈[-161,+76]mm · 3 of 80 slices shown (4 of 10)]
[im 1/80]
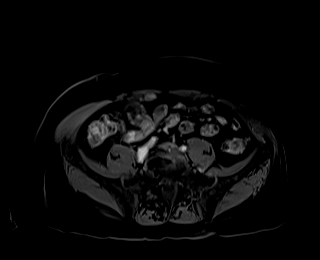
[im 40/80]
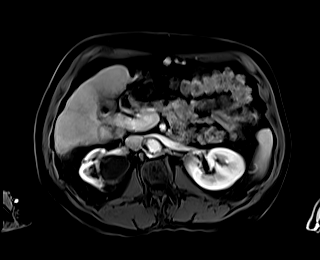
[im 80/80]
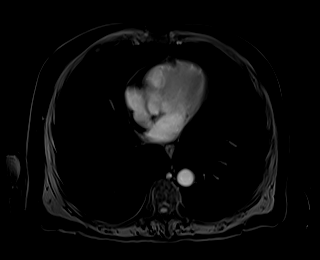

[Series 20: T1 dynamic · axial · 3.0mm · 1.25mm/px · z∈[-161,+76]mm · 3 of 80 slices shown (5 of 10)]
[im 1/80]
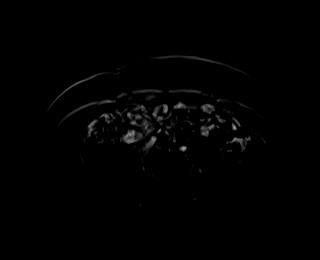
[im 40/80]
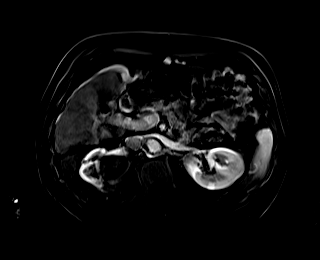
[im 80/80]
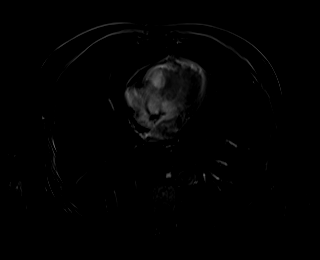

[Series 23: T1 dynamic · axial · 3.0mm · 1.25mm/px · z∈[-161,+76]mm · 3 of 80 slices shown (6 of 10)]
[im 1/80]
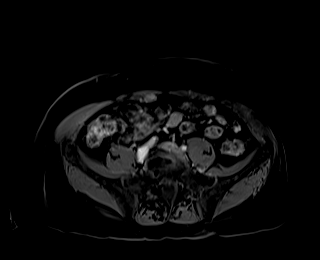
[im 40/80]
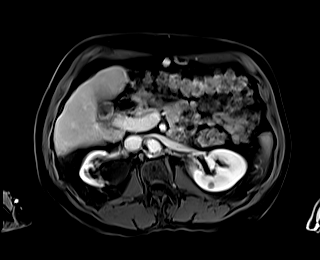
[im 80/80]
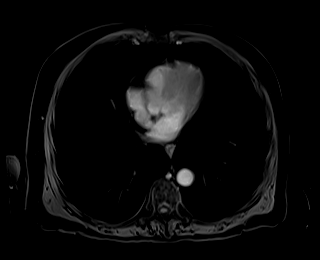

[Series 24: T1 dynamic · axial · 3.0mm · 1.25mm/px · z∈[-161,+76]mm · 3 of 80 slices shown (7 of 10)]
[im 1/80]
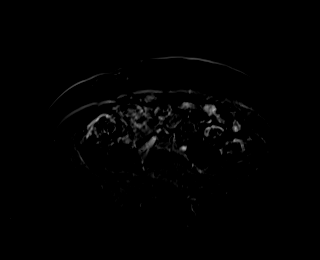
[im 40/80]
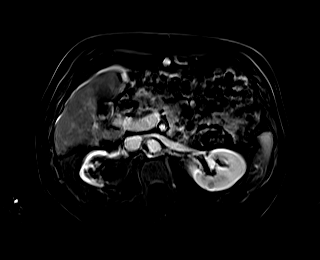
[im 80/80]
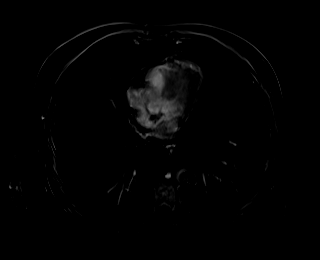

[Series 26: T1 dynamic · coronal · 3.0mm · 1.41mm/px · 3 of 72 slices shown (8 of 10)]
[im 1/72]
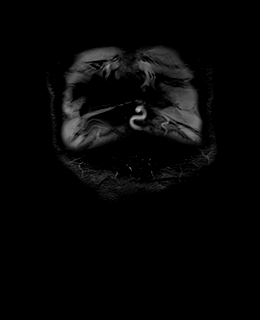
[im 36/72]
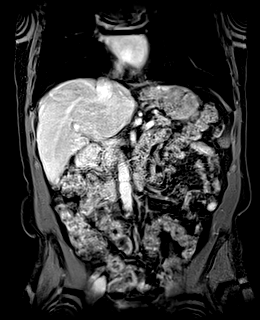
[im 72/72]
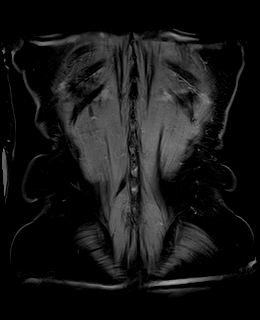

[Series 27: T2 · axial · 6.0mm · 1.56mm/px · 1 of 30 slices shown (2 of 2)]
[im 1/30]
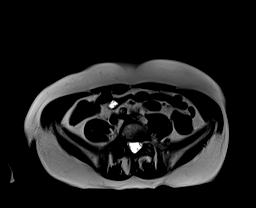

[Series 30: T1 dynamic · axial · 3.0mm · 1.25mm/px · z∈[-161,+76]mm · 3 of 80 slices shown (9 of 10)]
[im 1/80]
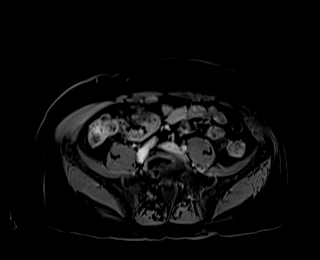
[im 40/80]
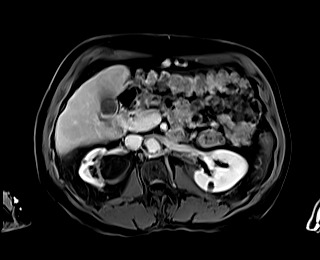
[im 80/80]
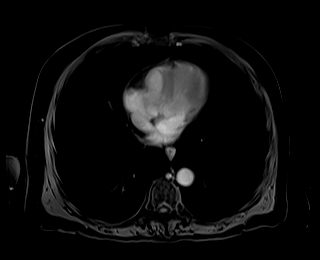

[Series 31: T1 dynamic · axial · 3.0mm · 1.25mm/px · z∈[-161,+76]mm · 3 of 80 slices shown (10 of 10)]
[im 1/80]
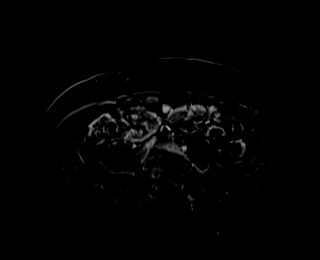
[im 40/80]
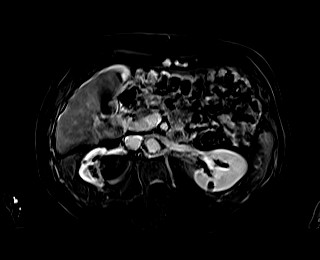
[im 80/80]
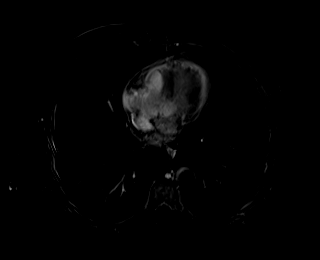

[48 of 48 positions shown; findings below may reference images not displayed]

FINDINGS: Lower chest: Unremarkable.

Hepatobiliary: Liver has a severely shrunken appearance and nodular
contour, indicative of advanced cirrhosis. The lesion of concern in
the left lobe of the liver located in segment 2 adjacent to the
falciform ligament (axial image 20 of series 30) currently measures
1.5 x 1.4 x 1.5 cm (previously 2.6 x 2.0 x 1.6 cm and is slightly
low T1 signal intensity, isointense on T2 weighted images, centrally
hypovascular with low-level peripheral enhancement on early post
gadolinium imaging, which becomes more defined and thicker on
delayed post gadolinium images. This lesion does not restrict
diffusion. No other suspicious appearing hepatic lesions are noted.
No intra or extrahepatic biliary ductal dilatation. 1 cm filling
defect lying dependently in the gallbladder, compatible with a
gallstone. Gallbladder is moderately distended. Gallbladder wall
thickness is normal. No pericholecystic fluid or surrounding
inflammatory changes.

Pancreas: No pancreatic mass. No pancreatic ductal dilatation. No
pancreatic or peripancreatic fluid collections or inflammatory
changes.

Spleen:  Unremarkable.

Adrenals/Urinary Tract: Again noted are multiple renal lesions
bilaterally, similar in size and number to the prior examination,
which are T1 hypointense, T2 hyperintense and do not enhance,
compatible with cysts. The majority of these are simple in
appearance, although some lesions in the upper pole of the right
kidney demonstrate internal thin septations (Bosniak class 2). The
largest of these lesions in the upper pole of the right kidney
measures up to 7.5 cm in diameter. No aggressive appearing renal
lesions. No hydroureteronephrosis in the visualized portions of the
abdomen. Bilateral adrenal glands are normal in appearance.

Stomach/Bowel: Visualized portions are unremarkable.

Vascular/Lymphatic: Extensive aortic atherosclerosis. No aneurysm
identified in the visualized abdominal vasculature. No
lymphadenopathy noted in the abdomen.

Other: No significant volume of ascites and no pneumoperitoneum
noted in the visualized portions of the skeleton.

Musculoskeletal: No aggressive appearing osseous lesions are noted
in the visualized portions of the skeleton. Dextroscoliosis of the
lumbar spine.
IMPRESSION: 1. Previously described lesion of concern in segment 2 of the liver
is smaller than prior examinations and has indeterminate imaging
characteristics. This lesion currently measures 1.5 x 1.4 x 1.5 cm,
is centrally hypovascular, and demonstrates some progressive
peripheral enhancement, categorized as LR-3 on today's study, but
favored to be benign given the lack of diffusion restriction and
interval regression of the lesion. Repeat abdominal MRI with and
without IV gadolinium is recommended in 12 months to re-evaluate
this lesion.
2. Cirrhosis.
3. Cholelithiasis without evidence of acute cholecystitis.
4. Multiple Bosniak class 1 and Bosniak class 2 cysts in the
kidneys, as above.
5. Aortic atherosclerosis.

## 2021-11-27 MED ORDER — GADOBUTROL 1 MMOL/ML IV SOLN
7.5000 mL | Freq: Once | INTRAVENOUS | Status: AC | PRN
  Administered 2021-11-27: 7.5 mL via INTRAVENOUS

## 2021-12-06 ENCOUNTER — Other Ambulatory Visit (INDEPENDENT_AMBULATORY_CARE_PROVIDER_SITE_OTHER): Payer: Medicare Other

## 2021-12-06 ENCOUNTER — Ambulatory Visit (INDEPENDENT_AMBULATORY_CARE_PROVIDER_SITE_OTHER): Payer: Medicare Other | Admitting: Gastroenterology

## 2021-12-06 ENCOUNTER — Encounter: Payer: Self-pay | Admitting: Gastroenterology

## 2021-12-06 VITALS — BP 122/68 | HR 54 | Ht 63.0 in | Wt 153.0 lb

## 2021-12-06 DIAGNOSIS — I701 Atherosclerosis of renal artery: Secondary | ICD-10-CM | POA: Diagnosis not present

## 2021-12-06 DIAGNOSIS — K7460 Unspecified cirrhosis of liver: Secondary | ICD-10-CM

## 2021-12-06 DIAGNOSIS — C220 Liver cell carcinoma: Secondary | ICD-10-CM

## 2021-12-06 LAB — COMPREHENSIVE METABOLIC PANEL
ALT: 22 U/L (ref 0–53)
AST: 26 U/L (ref 0–37)
Albumin: 3.8 g/dL (ref 3.5–5.2)
Alkaline Phosphatase: 88 U/L (ref 39–117)
BUN: 15 mg/dL (ref 6–23)
CO2: 25 mEq/L (ref 19–32)
Calcium: 9.5 mg/dL (ref 8.4–10.5)
Chloride: 105 mEq/L (ref 96–112)
Creatinine, Ser: 1.02 mg/dL (ref 0.40–1.50)
GFR: 71.49 mL/min (ref >60.00)
Glucose, Bld: 108 mg/dL — ABNORMAL HIGH (ref 70–99)
Potassium: 4.4 mEq/L (ref 3.5–5.1)
Sodium: 135 mEq/L (ref 135–145)
Total Bilirubin: 0.5 mg/dL (ref 0.2–1.2)
Total Protein: 6.7 g/dL (ref 6.0–8.3)

## 2021-12-06 LAB — CBC WITH DIFFERENTIAL/PLATELET
Basophils Absolute: 0 10*3/uL (ref 0.0–0.1)
Basophils Relative: 1.5 % (ref 0.0–3.0)
Eosinophils Absolute: 0.1 10*3/uL (ref 0.0–0.7)
Eosinophils Relative: 6.1 % — ABNORMAL HIGH (ref 0.0–5.0)
HCT: 37.5 % — ABNORMAL LOW (ref 39.0–52.0)
Hemoglobin: 12.6 g/dL — ABNORMAL LOW (ref 13.0–17.0)
Lymphocytes Relative: 36.8 % (ref 12.0–46.0)
Lymphs Abs: 0.8 10*3/uL (ref 0.7–4.0)
MCHC: 33.5 g/dL (ref 30.0–36.0)
MCV: 96.1 fl (ref 78.0–100.0)
Monocytes Absolute: 0.4 10*3/uL (ref 0.1–1.0)
Monocytes Relative: 15.6 % — ABNORMAL HIGH (ref 3.0–12.0)
Neutro Abs: 0.9 10*3/uL — ABNORMAL LOW (ref 1.4–7.7)
Neutrophils Relative %: 40 % — ABNORMAL LOW (ref 43.0–77.0)
Platelets: 100 10*3/uL — ABNORMAL LOW (ref 150.0–400.0)
RBC: 3.91 Mil/uL — ABNORMAL LOW (ref 4.22–5.81)
RDW: 16.1 % — ABNORMAL HIGH (ref 11.5–15.5)
WBC: 2.3 10*3/uL — ABNORMAL LOW (ref 4.0–10.5)

## 2021-12-06 LAB — PROTIME-INR
INR: 1.1 ratio — ABNORMAL HIGH (ref 0.8–1.0)
Prothrombin Time: 11.6 s (ref 9.6–13.1)

## 2021-12-06 NOTE — Patient Instructions (Addendum)
If you are age 76 or older, your body mass index should be between 23-30. Your Body mass index is 27.1 kg/m. If this is out of the aforementioned range listed, please consider follow up with your Primary Care Provider.  If you are age 53 or younger, your body mass index should be between 19-25. Your Body mass index is 27.1 kg/m. If this is out of the aformentioned range listed, please consider follow up with your Primary Care Provider.   ________________________________________________________  The [facility] providers would like to encourage you to use [facility] to communicate with providers for non-urgent requests or questions.  Due to long hold times on the telephone, sending your provider a message by [facility] may be a faster and more efficient way to get a response.  Please allow 48 business hours for a response.  Please remember that this is for non-urgent requests.  _______________________________________________________  Your provider has requested that you go to the basement level for lab work before leaving today. Press "B" on the elevator. The lab is located at the first door on the left as you exit the elevator.  Please call to make an appointment in 6 months and to get your MRI scheduled as well before the appointment if possible. Order has been placed.   Continue salt and fluid restricted diet.  Thank you,  Dr. Jackquline Denmark

## 2021-12-06 NOTE — Progress Notes (Signed)
IMPRESSION and PLAN:    #1. [place] Stage II s/p Y90 embolization 02/15/2021. Not a candidate for liver transplant d/t age, multiple comorbidities. AFP 12 to 5.5. MRI 11/2021 with improvement in liver lesion.  #2. 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 Liver Bx 01/2005-grade 3, stage IV cirrhosis.  Complicated by portal hypertension (splenomegaly, thrombocytopenia/pancytopenia, esophageal varices with history of bleeding status post EVL, ascites and hepatic encephalopathy).  #3.  EV s/p EVL 2007, Most recent EGD 02/2013-small EV, too small for EVL, small fundal varices, small HH, mild pHTN gastropathy, short segment Barrett's esophagus..  #4.  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 Loletha Grayer CT was done for [facility] screening).  Patient does not want to pursue any further work-up/treatment.     Plan: -CBC, CMP, AFP, PT INR -MRI liver with/without contrast in 6 months -Continue current meds. Didn't qualify for rifaxamin program -Has FU appt with Dr Enis Gash in [place] 2023. -Continue salt and fluid restricted diet. -FU in 6 months.  HPI:    Chief Complaint:   [facility]2021 Underlying ETOH liver cirrhosis with portal hypertension [facility]Dr CEnis Gashand Dr. DEstelle Grumbles.  Not a candidate for liver transplant d/t age, multiple comorbidities.  For FU MRI w/t contrast 11/2021 showed improvement in liver lesion, now measuring 1.5 x 1.5 cm.  Radiology recommends repeat MRI with contrast in 1 year. No PV thrombosis. I think we should repeat it in 6 months[facility] any lung [facility]. AFP has improved from 12 to 5.5.  No GI complaints.  No nausea, vomiting, heartburn, regurgitation, odynophagia or dysphagia.  No constipation.  No melena or hematochezia. No unintentional weight loss. No abdominal pain.  Has been taking lactulose every day with resultant  2-3 soft BMs per day.   He did not qualify for rifaximin compassionate program as he "makes too much money"    ONCOLOGY HISTORY  [place] stage II 11/19/20 MRI abdomen shows exophytic segment 2 lesion measuring up to 2.4 cm with peripheral arterial hyperenhancement, probable washout on delayed images, and enhancing capsule. LR-5 12/01/20 CT chest shows Clustered nodules of the left lower lobe, likely infectious or inflammatory, but no evidence of metastatic disease.  12/09/20 Bone scan whole body with no definite scintigraphic evidence of osseous metastatic disease  11/30/20 AFP 12.8 05/04/21 AFP stable.  MRI 11/29/2021 1. Previously described lesion of concern in segment 2 of the liver is smaller than prior examinations and has indeterminate imaging characteristics. This lesion currently measures 1.5 x 1.4 x 1.5 cm, is centrally hypovascular, and demonstrates some progressive peripheral enhancement, categorized as LR-3 on today's study, but favored to be benign given the lack of diffusion restriction and interval regression of the lesion. Repeat abdominal MRI with and without IV gadolinium is recommended in 12 months to re-evaluate this lesion. 2. Cirrhosis. 3. Cholelithiasis without evidence of acute cholecystitis. 4. Multiple Bosniak class 1 and Bosniak class 2 cysts in the kidneys, as above. 5. Aortic atherosclerosis.    Past Medical History:  Diagnosis Date   Barrett's esophagus    CAD (coronary artery disease)    Cirrhosis (HCC)    COPD (chronic obstructive pulmonary disease) (HCC)    Esophageal varices ([place]    Heart attack ([place] 2000   Hepatic encephalopathy (HCC)    Hypercholesteremia    Hypertension     Current Outpatient Medications  Medication Sig Dispense Refill   carvedilol (COREG) 12.5 MG tablet Take 1 tablet (12.5 mg total) by mouth 2 (two) times daily. 60 tablet 11   enalapril (VASOTEC) 5 MG tablet Take 2.5 mg by mouth 2 (two) times daily.      furosemide  (LASIX) 40 MG tablet TAKE 1 TABLET BY MOUTH DAILY 30 tablet 11   lactulose (CHRONULAC) 10 GM/15ML solution Take 30 ml by mouth twice daily. (Aim 2-3 Bowel movements a day) 1800 mL 11   Multiple Vitamin (MULTIVITAMIN) capsule Take 1 capsule by mouth daily.      nitroGLYCERIN (NITROSTAT) 0.4 MG SL tablet Place 1 tablet (0.4 mg total) under the tongue every 5 (five) minutes as needed. 25 tablet 3   Omega-3 1000 MG CAPS Take 3 capsules by mouth daily.      omeprazole (PRILOSEC) 20 MG capsule TAKE 1 CAPSULE BY MOUTH DAILY 30 capsule 11   potassium chloride SA (KLOR-CON M) 20 MEQ tablet Take 1 tablet (20 mEq total) by mouth 2 (two) times daily. 60 tablet 11   temazepam (RESTORIL) 30 MG capsule Take 30 mg by mouth at bedtime as needed.     traMADol (ULTRAM) 50 MG tablet Take 50 mg by mouth every 6 (six) hours as needed.      No current facility-administered medications for this visit.    Family History  Problem Relation Age of Onset   Cancer Brother        mouth per patient    Colon cancer Neg Hx    Esophageal cancer Neg Hx     Social History   Tobacco Use   Smoking status: Former   Smokeless tobacco: Former   Tobacco comments:    quit smoking in 2000   Vaping Use   Vaping Use: Never used  Substance Use Topics   Alcohol use: Not Currently    Comment: quit in 2006   Drug use: Not Currently    Allergies  Allergen Reactions   Codeine      Review of Systems: All systems reviewed and negative except where noted in HPI.    Physical Exam:     BP 122/68   Pulse (!) 54   Ht '5\' 3"'$  (1.6 m)   Wt 153 lb (69.4 kg)   SpO2 98%   BMI 27.10 kg/m   GENERAL:  Alert, oriented, cooperative, not in acute distress. PSYCH: :Pleasant, normal mood and affect. HEENT:  conjunctiva pink, mucous membranes moist, neck supple without masses. No jaundice. CARDIAC:  S1 S2 normal. No murmers. PULM: Normal respiratory effort, lungs CTA bilaterally, no wheezing. ABDOMEN: Inspection: No visible  peristalsis, no abnormal pulsations, skin normal.  Palpation/percussion: Soft, nontender, nondistended, no rigidity, no abnormal dullness to percussion, no hepatosplenomegaly and no palpable abdominal masses.  Auscultation: Normal bowel sounds, no abdominal bruits. Rectal exam: Deferred SKIN:  turgor, no lesions seen. Musculoskeletal:  Normal muscle tone, normal strength. NEURO: Alert and oriented x 3, no focal neurologic deficits.    Latest Ref Rng & Units 08/31/2021   12:00 AM 06/29/2021    2:30 PM 04/28/2021   12:00 AM  CBC  WBC  2.7     2.2 Repeated and verified X2.  3.3   Hemoglobin 13.5 - 17.5 12.4     12.3  13.0   Hematocrit 41 - 53 38     37.6  39   Platelets 150 - 400 K/uL 104     93.0  112      This  result is from an external source.      Latest Ref Rng & Units 08/31/2021   12:00 AM 06/29/2021    2:30 PM 04/28/2021   12:00 AM  CMP  Glucose 70 - 99 mg/dL  96    BUN 4 - '21 13     13  19   '$ Creatinine 0.6 - 1.3 0.8     0.88  1.0   Sodium 137 - 147 130     137  140   Potassium 3.5 - 5.1 mEq/L 3.7     4.6  4.5   Chloride 99 - 108 106     103  108   CO2 13 - '22 26     26  25   '$ Calcium 8.7 - 10.7 9.1     8.8  9.0   Total Protein 6.0 - 8.3 g/dL  6.5    Total Bilirubin 0.2 - 1.2 mg/dL  0.6    Alkaline Phos 25 - 125 96     74  118   AST 14 - 40 37     27  42   ALT 10 - 40 U/L '31     23  26      '$ This result is from an external source.        Namya Voges,MD 12/06/2021, 11:26 AM   CC Dr Delena Bali

## 2021-12-08 LAB — AFP TUMOR MARKER: AFP-Tumor Marker: 3.6 ng/mL (ref <6.1)

## 2022-02-05 NOTE — Progress Notes (Signed)
Unsuccessful referral letter printed and mailed to pt.

## 2022-02-13 ENCOUNTER — Telehealth: Payer: Self-pay | Admitting: Cardiology

## 2022-02-13 MED ORDER — NITROGLYCERIN 0.4 MG SL SUBL: 0.4000 mg | SUBLINGUAL_TABLET | SUBLINGUAL | 6 refills | Status: AC | PRN

## 2022-02-13 NOTE — Telephone Encounter (Signed)
Pt c/o medication issue:  1. Name of Medication: nitroGLYCERIN (NITROSTAT) 0.4 MG SL tablet  2. How are you currently taking this medication (dosage and times per day)? Place 1 tablet (0.4 mg total) under the tongue every 5 (five) minutes as needed  3. Are you having a reaction (difficulty breathing--STAT)? No   4. What is your medication issue? Patient needs a new refill 30 days,  3 fill prescription filled

## 2022-02-16 ENCOUNTER — Other Ambulatory Visit: Payer: Self-pay

## 2022-02-16 MED ORDER — CARVEDILOL 12.5 MG PO TABS: 12.5000 mg | ORAL_TABLET | Freq: Two times a day (BID) | ORAL | 11 refills | Status: DC

## 2022-03-04 NOTE — Progress Notes (Signed)
[facility]  [address] [place],  [place]  [address] [phone]  Clinic Day:  03/05/2022  Referring physician: Barnetta Chapel, NP   HISTORY OF PRESENT ILLNESS:  The patient is a 76 y.o. male  with hepatocellular carcinoma.  The patient has had underlying cirrhosis since 2006.  He underwent Y-90 chemoembolization to his one focus of [place] in August 2022.  He comes in today for routine follow-up.  Since his last visit, the patient has been doing fairly well.  Of note, he undergoes routine liver MRIs to follow his hepatocellular carcinoma.  His most recent study was done in June 2023, which his left hepatic lesion to be even smaller in size.  Furthermore, his AFP done in June 2023 was normal.  Overall, the patient is doing fairly well.  He continues to deny having right upper quadrant pain, nausea, jaundice, or other symptoms/findings which concern him for disease progression.    PHYSICAL EXAM:  Blood pressure 134/63, pulse 62, temperature 98.4 F (36.9 C), resp. rate 16, height '5\' 3"'$  (1.6 m), weight 150 lb 12.8 oz (68.4 kg), SpO2 96 %. Wt Readings from Last 3 Encounters:  03/05/22 150 lb 12.8 oz (68.4 kg)  12/06/21 153 lb (69.4 kg)  08/31/21 149 lb 6.4 oz (67.8 kg)   Body mass index is 26.71 kg/m. Performance status (ECOG): 1 - Symptomatic but completely ambulatory Physical Exam Constitutional:      Appearance: Normal appearance. He is not ill-appearing.  HENT:     Mouth/Throat:     Mouth: Mucous membranes are moist.     Pharynx: Oropharynx is clear. No oropharyngeal exudate or posterior oropharyngeal erythema.  Cardiovascular:     Rate and Rhythm: Normal rate and regular rhythm.     Heart sounds: No murmur heard.    No friction rub. No gallop.  Pulmonary:     Effort: Pulmonary effort is normal. No respiratory distress.     Breath sounds: Normal breath sounds. No wheezing, rhonchi or rales.  Abdominal:     General: Bowel sounds are normal.  There is no distension.     Palpations: Abdomen is soft. There is no mass.     Tenderness: There is no abdominal tenderness.  Musculoskeletal:        General: No swelling.     Right lower leg: No edema.     Left lower leg: No edema.  Lymphadenopathy:     Cervical: No cervical adenopathy.     Upper Body:     Right upper body: No supraclavicular or axillary adenopathy.     Left upper body: No supraclavicular or axillary adenopathy.     Lower Body: No right inguinal adenopathy. No left inguinal adenopathy.  Skin:    General: Skin is warm.     Coloration: Skin is not jaundiced.     Findings: No lesion or rash.  Neurological:     General: No focal deficit present.     Mental Status: He is alert and oriented to person, place, and time. Mental status is at baseline.  Psychiatric:        Mood and Affect: Mood normal.        Behavior: Behavior normal.        Thought Content: Thought content normal.   LABS:    Latest Reference Range & Units 12/06/21 11:57  AFP Tumor Marker <6.1 ng/mL 3.6   SCANS:  His abdominal MRI in June 2023 revealed the following: FINDINGS: Lower chest:  Unremarkable.  Hepatobiliary: Liver has a severely shrunken appearance and nodular contour, indicative of advanced cirrhosis. The lesion of concern in the left lobe of the liver located in segment 2 adjacent to the falciform ligament (axial image 20 of series 30) currently measures 1.5 x 1.4 x 1.5 cm (previously 2.6 x 2.0 x 1.6 cm and is slightly low T1 signal intensity, isointense on T2 weighted images, centrally hypovascular with low-level peripheral enhancement on early post gadolinium imaging, which becomes more defined and thicker on delayed post gadolinium images. This lesion does not restrict diffusion. No other suspicious appearing hepatic lesions are noted. No intra or extrahepatic biliary ductal dilatation. 1 cm filling defect lying dependently in the gallbladder, compatible with a gallstone.  Gallbladder is moderately distended. Gallbladder wall thickness is normal. No pericholecystic fluid or surrounding inflammatory changes.  Pancreas: No pancreatic mass. No pancreatic ductal dilatation. No pancreatic or peripancreatic fluid collections or inflammatory changes.  Spleen: Unremarkable.  Adrenals/Urinary Tract: Again noted are multiple renal lesions bilaterally, similar in size and number to the prior examination, which are T1 hypointense, T2 hyperintense and do not enhance, compatible with cysts. The majority of these are simple in appearance, although some lesions in the upper pole of the right kidney demonstrate internal thin septations (Bosniak class 2). The largest of these lesions in the upper pole of the right kidney measures up to 7.5 cm in diameter. No aggressive appearing renal lesions. No hydroureteronephrosis in the visualized portions of the abdomen. Bilateral adrenal glands are normal in appearance.  Stomach/Bowel: Visualized portions are unremarkable.  Vascular/Lymphatic: Extensive aortic atherosclerosis. No aneurysm identified in the visualized abdominal vasculature. No lymphadenopathy noted in the abdomen.  Other: No significant volume of ascites and no pneumoperitoneum noted in the visualized portions of the skeleton.  Musculoskeletal: No aggressive appearing osseous lesions are noted in the visualized portions of the skeleton. Dextroscoliosis of the lumbar spine.  IMPRESSION: 1. Previously described lesion of concern in segment 2 of the liver is smaller than prior examinations and has indeterminate imaging characteristics. This lesion currently measures 1.5 x 1.4 x 1.5 cm, is centrally hypovascular, and demonstrates some progressive peripheral enhancement, categorized as LR-3 on today's study, but favored to be benign given the lack of diffusion restriction and interval regression of the lesion. Repeat abdominal MRI with and without IV  gadolinium is recommended in 12 months to re-evaluate this lesion. 2. Cirrhosis. 3. Cholelithiasis without evidence of acute cholecystitis. 4. Multiple Bosniak class 1 and Bosniak class 2 cysts in the kidneys, as above. 5. Aortic atherosclerosis.  ASSESSMENT & PLAN:  A 76 y.o. male with stage IB (T1b N0 M0) hepatocellular carcinoma, status post Y90 chemoembolization in late August 2022.  Based upon his labs, recent MRI, and physical exam, there is nothing to suggest his hepatocellular carcinoma is progressing.  The previous plan was for this patient to undergo an abdominal MRI every 6 months.  As one was done in June, I will be sure his next MRI is done December 2023 for his continued liver cancer surveillance.  His alpha-fetoprotein will also be checked at that time to ensure there are no biochemical signs of disease progression.  Clinically, he is doing well.  I will see him back in December 2023 to go over his scans and their implications.  The patient understands all the plans discussed today and is in agreement with them.   Kassi Esteve Macarthur Critchley, MD

## 2022-03-05 ENCOUNTER — Inpatient Hospital Stay: Payer: Medicare Other | Attending: Oncology | Admitting: Oncology

## 2022-03-05 ENCOUNTER — Other Ambulatory Visit: Payer: Self-pay | Admitting: Oncology

## 2022-03-05 VITALS — BP 134/63 | HR 62 | Temp 98.4°F | Resp 16 | Ht 63.0 in | Wt 150.8 lb

## 2022-03-05 DIAGNOSIS — C220 Liver cell carcinoma: Secondary | ICD-10-CM

## 2022-05-21 ENCOUNTER — Telehealth: Payer: Self-pay | Admitting: Oncology

## 2022-05-21 NOTE — Telephone Encounter (Signed)
Notified of upcoming MRI appt date, time, and instructions.

## 2022-05-23 ENCOUNTER — Other Ambulatory Visit (HOSPITAL_COMMUNITY): Payer: Self-pay

## 2022-05-28 ENCOUNTER — Ambulatory Visit (HOSPITAL_COMMUNITY): Payer: Medicare Other

## 2022-05-30 ENCOUNTER — Other Ambulatory Visit (HOSPITAL_COMMUNITY): Payer: Self-pay | Admitting: Rheumatology

## 2022-05-30 ENCOUNTER — Encounter (HOSPITAL_COMMUNITY): Payer: Self-pay | Admitting: Rheumatology

## 2022-05-30 DIAGNOSIS — M5416 Radiculopathy, lumbar region: Secondary | ICD-10-CM

## 2022-05-31 ENCOUNTER — Ambulatory Visit (HOSPITAL_COMMUNITY)
Admission: RE | Admit: 2022-05-31 | Discharge: 2022-05-31 | Disposition: A | Discharge status: Home / Self Care | Payer: Medicare Other | Source: Ambulatory Visit | Attending: Oncology | Admitting: Oncology

## 2022-05-31 DIAGNOSIS — C220 Liver cell carcinoma: Secondary | ICD-10-CM | POA: Diagnosis present

## 2022-05-31 MED ORDER — GADOBUTROL 1 MMOL/ML IV SOLN
7.0000 mL | Freq: Once | INTRAVENOUS | Status: AC | PRN
  Administered 2022-05-31: 7 mL via INTRAVENOUS

## 2022-06-07 ENCOUNTER — Ambulatory Visit (HOSPITAL_COMMUNITY)
Admission: RE | Admit: 2022-06-07 | Discharge: 2022-06-07 | Disposition: A | Discharge status: Home / Self Care | Payer: Medicare Other | Source: Ambulatory Visit | Attending: Rheumatology | Admitting: Rheumatology

## 2022-06-07 DIAGNOSIS — M5416 Radiculopathy, lumbar region: Secondary | ICD-10-CM | POA: Diagnosis present

## 2022-06-08 ENCOUNTER — Inpatient Hospital Stay: Payer: Medicare Other

## 2022-06-08 ENCOUNTER — Other Ambulatory Visit: Payer: Self-pay | Admitting: Oncology

## 2022-06-08 ENCOUNTER — Inpatient Hospital Stay: Payer: Medicare Other | Attending: Oncology | Admitting: Oncology

## 2022-06-08 DIAGNOSIS — Z8505 Personal history of malignant neoplasm of liver: Secondary | ICD-10-CM | POA: Insufficient documentation

## 2022-06-08 DIAGNOSIS — C220 Liver cell carcinoma: Secondary | ICD-10-CM

## 2022-06-08 DIAGNOSIS — K7460 Unspecified cirrhosis of liver: Secondary | ICD-10-CM | POA: Insufficient documentation

## 2022-06-08 NOTE — Progress Notes (Signed)
[facility]  [address] [place],  [place]  [address] [phone]  Clinic Day:  06/08/2022  Referring physician: Barnetta Chapel, NP   HISTORY OF PRESENT ILLNESS:  The patient is a 76 y.o. male  with hepatocellular carcinoma.  The patient has had underlying cirrhosis since 2006.  He underwent Y-90 chemoembolization to his one focus of [place] in August 2022.  He comes in today to go over his most recent abdominal MRI for his hepatocellular carcinoma surveillance.  Overall, the patient is doing fairly well.  He continues to deny having right upper quadrant pain, nausea, jaundice, or other symptoms/findings which concern him for disease recurrence/progression.    PHYSICAL EXAM:  Blood pressure (!) 161/81, pulse 64, temperature 97.6 F (36.4 C), resp. rate 16, height '5\' 3"'$  (1.6 m), weight 155 lb 11.2 oz (70.6 kg), SpO2 99 %. Wt Readings from Last 3 Encounters:  06/08/22 155 lb 11.2 oz (70.6 kg)  03/05/22 150 lb 12.8 oz (68.4 kg)  12/06/21 153 lb (69.4 kg)   Body mass index is 27.58 kg/m. Performance status (ECOG): 1 - Symptomatic but completely ambulatory Physical Exam Constitutional:      Appearance: Normal appearance. He is not ill-appearing.  HENT:     Mouth/Throat:     Mouth: Mucous membranes are moist.     Pharynx: Oropharynx is clear. No oropharyngeal exudate or posterior oropharyngeal erythema.  Cardiovascular:     Rate and Rhythm: Normal rate and regular rhythm.     Heart sounds: No murmur heard.    No friction rub. No gallop.  Pulmonary:     Effort: Pulmonary effort is normal. No respiratory distress.     Breath sounds: Normal breath sounds. No wheezing, rhonchi or rales.  Abdominal:     General: Bowel sounds are normal. There is no distension.     Palpations: Abdomen is soft. There is no mass.     Tenderness: There is no abdominal tenderness.  Musculoskeletal:        General: No swelling.     Right lower leg: No edema.     Left  lower leg: No edema.  Lymphadenopathy:     Cervical: No cervical adenopathy.     Upper Body:     Right upper body: No supraclavicular or axillary adenopathy.     Left upper body: No supraclavicular or axillary adenopathy.     Lower Body: No right inguinal adenopathy. No left inguinal adenopathy.  Skin:    General: Skin is warm.     Coloration: Skin is not jaundiced.     Findings: No lesion or rash.  Neurological:     General: No focal deficit present.     Mental Status: He is alert and oriented to person, place, and time. Mental status is at baseline.  Psychiatric:        Mood and Affect: Mood normal.        Behavior: Behavior normal.        Thought Content: Thought content normal.  LABS:    Latest Reference Range & Units 06/08/22 14:53  AFP, Serum, Tumor Marker 0.0 - 8.4 ng/mL 1.9   SCANS:  His abdominal MRI in December 2023 revealed the following: FINDINGS: Lower chest: No acute findings.  Hepatobiliary: Cirrhotic morphology. Continued contraction of posttreatment changes of segment 2 hepatocellular carcinoma. A few foci of faintly enhancing peripheral nodular foci are present (21:50), overall similar in appearance dating back to 07/05/2021. Tubular areas of non enhancement adjacent  to the ablation zone may reflect thrombosis of the adjacent vessels. No new arterially enhancing observations. No bile duct dilation. Cholelithiasis.  Pancreas: No mass, inflammatory changes, or other parenchymal abnormality identified.  Spleen: Within normal limits in size and appearance.  Adrenals/Urinary Tract: No adrenal nodules. No suspicious renal masses identified. No evidence of hydronephrosis. Bilateral cysts, a few of which contain thin internal septations or are hemorrhagic/proteinaceous.  Stomach/Bowel: Visualized portions within the abdomen are unremarkable.  Vascular/Lymphatic: No pathologically enlarged lymph nodes identified. No abdominal aortic aneurysm demonstrated.  Recanalized paraumbilical vein. Aortic atherosclerosis.  Other: None.  Musculoskeletal: No suspicious bone lesions identified.  IMPRESSION: 1. Continued contraction of posttreatment changes of segment 2 hepatocellular carcinoma. A few foci of faintly enhancing peripheral nodular foci are present, overall similar in appearance dating back to 07/05/2021. LR-TR Equivocal. 2. No new arterially enhancing observations. 3. Cirrhosis with sequela of portal hypertension including recanalized paraumbilical vein. 4. Cholelithiasis. 5. Aortic Atherosclerosis (ICD10-I70.0).  ASSESSMENT & PLAN:  A 76 y.o. male with stage IB (T1b N0 M0) hepatocellular carcinoma, status post Y90 chemoembolization in late August 2022.  In clinic today, went over all of his abdominal MRI results with him, which continue to show no evidence of disease recurrence.  Furthermore, I am comforted by the fact that his alpha-fetoprotein level remains normal.  Overall, it appears the patient remains disease-free.  Based upon this, I will see him back in another 6 months for repeat clinical assessment.  He will undergo another abdominal MRI before his next visit with continued liver cancer surveillance.  His alpha-fetoprotein will also be checked at that time to ensure there is no biochemical evidence of disease progression.  The patient understands all the plans discussed today and is in agreement with them.   Lella Mullany Macarthur Critchley, MD

## 2022-06-09 LAB — AFP TUMOR MARKER: AFP, Serum, Tumor Marker: 1.9 ng/mL (ref 0.0–8.4)

## 2022-06-22 ENCOUNTER — Ambulatory Visit: Payer: Medicare Other | Admitting: Gastroenterology

## 2022-07-31 ENCOUNTER — Ambulatory Visit: Payer: Medicare Other | Admitting: Gastroenterology

## 2022-07-31 ENCOUNTER — Encounter: Payer: Self-pay | Admitting: Gastroenterology

## 2022-07-31 VITALS — BP 118/78 | HR 60 | Ht 63.0 in | Wt 152.0 lb

## 2022-07-31 DIAGNOSIS — C220 Liver cell carcinoma: Secondary | ICD-10-CM | POA: Diagnosis not present

## 2022-07-31 DIAGNOSIS — I8500 Esophageal varices without bleeding: Secondary | ICD-10-CM | POA: Diagnosis not present

## 2022-07-31 DIAGNOSIS — K7460 Unspecified cirrhosis of liver: Secondary | ICD-10-CM | POA: Diagnosis not present

## 2022-07-31 DIAGNOSIS — I701 Atherosclerosis of renal artery: Secondary | ICD-10-CM | POA: Diagnosis not present

## 2022-07-31 NOTE — Patient Instructions (Addendum)
_______________________________________________________  If your blood pressure at your visit was 140/90 or greater, please contact your primary care physician to follow up on this.  _______________________________________________________  If you are age 77 or older, your body mass index should be between 23-30. Your Body mass index is 26.93 kg/m. If this is out of the aforementioned range listed, please consider follow up with your Primary Care Provider.  If you are age 79 or younger, your body mass index should be between 19-25. Your Body mass index is 26.93 kg/m. If this is out of the aformentioned range listed, please consider follow up with your Primary Care Provider.   ________________________________________________________  The [facility] providers would like to encourage you to use [facility] to communicate with providers for non-urgent requests or questions.  Due to long hold times on the telephone, sending your provider a message by [facility] may be a faster and more efficient way to get a response.  Please allow 48 business hours for a response.  Please remember that this is for non-urgent requests.  _______________________________________________________  Your provider has requested that you go to the basement level for lab work before leaving today. Press "B" on the elevator. The lab is located at the first door on the left as you exit the elevator.  Please follow up in 6 months. Give [place] a call at [phone] to schedule an appointment in June 2024 for an August appointment.  You have been scheduled for an MRI at [facility] on 11-30-2022. Your appointment time is 10am. Please arrive to admitting (at main entrance of the hospital) 30 minutes prior to your appointment time for registration purposes. Please make certain not to have anything to eat or drink 6 hours prior to your test. In addition, if you have any metal in your body, have a pacemaker or defibrillator, please be  sure to let your ordering physician know. This test typically takes 45 minutes to 1 hour to complete. Should you need to reschedule, please call [phone] to do so.  You have been scheduled for an endoscopy. Please follow written instructions given to you at your visit today. If you use inhalers (even only as needed), please bring them with you on the day of your procedure.  Continue salt restriction  Thank you,  Dr. Jackquline Denmark

## 2022-07-31 NOTE — Progress Notes (Signed)
IMPRESSION and PLAN:    #1. Curtis Mullins Stage II s/p Y90 embolization 02/15/2021 with excellent results.  No progression or recurrence MRI 05/2022. Not a candidate for liver transplant d/t age, multiple comorbidities. AFP 12 to 5.5 to 1.9 (05/2022). MRI 11/2021 with improvement in liver lesion. Neg MRI with contrast 05/2022.  #2. 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 Liver Bx 01/2005-grade 3, stage IV cirrhosis.  Complicated by portal hypertension (splenomegaly, thrombocytopenia/pancytopenia, esophageal varices with history of bleeding status post EVL, ascites and hepatic encephalopathy).  #3.  EV s/p EVL 2007, Most recent EGD 02/2013-small EV, too small for EVL, small fundal varices, small HH, mild pHTN gastropathy, short segment Barrett's esophagus.  #4.  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 Loletha Grayer CT was done for [facility] screening).  Patient does not want to pursue any further work-up/treatment.     Plan: -CBC, CMP, PT INR -EGD for EV screening and Barretts  -MRI liver with/without contrast in June 2024 -Continue salt restricted diet. -FU in 6 months.   [name], pl print out last EGD/colon report  HPI:    Chief Complaint:   77yrold WM [facility] embol[facility] 02/15/2021 Underlying ETOH liver cirrhosis with portal hypertension [facility]Dr CEnis Gashand Dr. DEstelle Grumbles.  Not a candidate for liver transplant d/t age, multiple comorbidities.  For FU Doing very well MRI 05/2022-no recurrence or progression.  Better than MRI with contrast 11/2021.  No PV thrombosis.  AFP has improved and is 1.9 now 05/2022.  He denies having any GI complaints.  No nausea, vomiting, heartburn, regurgitation, odynophagia or dysphagia.  No constipation.  No melena or hematochezia. No unintentional weight loss. No abdominal pain.  Has been taking lactulose every day with resultant 2-3 soft BMs per day.   He did not qualify for  rifaximin compassionate program as he "makes too much money"    ONCOLOGY HISTORY  [place] stage II 11/19/20 MRI abdomen shows exophytic segment 2 lesion measuring up to 2.4 cm with peripheral arterial hyperenhancement, probable washout on delayed images, and enhancing capsule. LR-5 12/01/20 CT chest shows Clustered nodules of the left lower lobe, likely infectious or inflammatory, but no evidence of metastatic disease.  12/09/20 Bone scan whole body with no definite scintigraphic evidence of osseous metastatic disease  11/30/20 AFP 12.8 No progression or recurrence MRI 05/2022, AFP 1.9   MRI 11/29/2021 1. Previously described lesion of concern in segment 2 of the liver is smaller than prior examinations and has indeterminate imaging characteristics. This lesion currently measures 1.5 x 1.4 x 1.5 cm, is centrally hypovascular, and demonstrates some progressive peripheral enhancement, categorized as LR-3 on today's study, but favored to be benign given the lack of diffusion restriction and interval regression of the lesion. Repeat abdominal MRI with and without IV gadolinium is recommended in 12 months to re-evaluate this lesion. 2. Cirrhosis. 3. Cholelithiasis without evidence of acute cholecystitis. 4. Multiple Bosniak class 1 and Bosniak class 2 cysts in the kidneys, as above. 5. Aortic atherosclerosis.    MRI liver with and without contrast 06/01/2022 1. Continued contraction of posttreatment changes of segment 2 hepatocellular carcinoma. A few foci of faintly enhancing peripheral nodular foci are present, overall similar in appearance dating back to 07/05/2021. LR-TR Equivocal. 2. No new arterially enhancing observations. 3. Cirrhosis with sequela of portal hypertension including recanalized paraumbilical vein. 4. Cholelithiasis. 5.  Aortic Atherosclerosis (ICD10-I70.0).  Past Medical History:  Diagnosis  Date   Barrett's esophagus    CAD (coronary artery disease)    Cirrhosis  (HCC)    COPD (chronic obstructive pulmonary disease) (HCC)    Esophageal varices (HCC)    Heart attack ([place]) 2000   Hepatic encephalopathy (HCC)    Hypercholesteremia    Hypertension     Current Outpatient Medications  Medication Sig Dispense Refill   carvedilol (COREG) 12.5 MG tablet Take 1 tablet (12.5 mg total) by mouth 2 (two) times daily. 60 tablet 11   enalapril (VASOTEC) 5 MG tablet Take 2.5 mg by mouth 2 (two) times daily.      furosemide (LASIX) 40 MG tablet TAKE 1 TABLET BY MOUTH DAILY 30 tablet 11   lactulose (CHRONULAC) 10 GM/15ML solution Take 30 ml by mouth twice daily. (Aim 2-3 Bowel movements a day) 1800 mL 11   Multiple Vitamin (MULTIVITAMIN) capsule Take 1 capsule by mouth daily.      nitroGLYCERIN (NITROSTAT) 0.4 MG SL tablet Place 1 tablet (0.4 mg total) under the tongue every 5 (five) minutes as needed. 25 tablet 6   Omega-3 1000 MG CAPS Take 3 capsules by mouth daily.      omeprazole (PRILOSEC) 20 MG capsule TAKE 1 CAPSULE BY MOUTH DAILY 30 capsule 11   potassium chloride SA (KLOR-CON M) 20 MEQ tablet Take 1 tablet (20 mEq total) by mouth 2 (two) times daily. 60 tablet 11   temazepam (RESTORIL) 30 MG capsule Take 30 mg by mouth at bedtime as needed.     traMADol (ULTRAM) 50 MG tablet Take 50 mg by mouth every 6 (six) hours as needed.      No current facility-administered medications for this visit.    Family History  Problem Relation Age of Onset   Cancer Brother        mouth per patient    Colon cancer Neg Hx    Esophageal cancer Neg Hx     Social History   Tobacco Use   Smoking status: Former   Smokeless tobacco: Former   Tobacco comments:    quit smoking in 2000   Vaping Use   Vaping Use: Never used  Substance Use Topics   Alcohol use: Not Currently    Comment: quit in 2006   Drug use: Not Currently    Allergies  Allergen Reactions   Codeine      Review of Systems: All systems reviewed and negative except where noted in HPI.     Physical Exam:     BP 118/78   Pulse 60   Ht 5' 3"$  (1.6 m)   Wt 152 lb (68.9 kg)   SpO2 99%   BMI 26.93 kg/m   GENERAL:  Alert, oriented, cooperative, not in acute distress. PSYCH: :Pleasant, normal mood and affect. HEENT:  conjunctiva pink, mucous membranes moist, neck supple without masses. No jaundice. CARDIAC:  S1 S2 normal. No murmers. PULM: Normal respiratory effort, lungs CTA bilaterally, no wheezing. ABDOMEN: Inspection: No visible peristalsis, no abnormal pulsations, skin normal.  Palpation/percussion: Soft, nontender, nondistended, no rigidity, no abnormal dullness to percussion, no hepatosplenomegaly and no palpable abdominal masses.  Auscultation: Normal bowel sounds, no abdominal bruits. Rectal exam: Deferred SKIN:  turgor, no lesions seen. Musculoskeletal:  Normal muscle tone, normal strength. NEURO: Alert and oriented x 3, no focal neurologic deficits.    Latest Ref Rng & Units 12/06/2021   11:57 AM 08/31/2021   12:00 AM 06/29/2021    2:30 PM  CBC  WBC 4.0 - 10.5  K/uL 2.3 Repeated and verified X2.  2.7     2.2 Repeated and verified X2.   Hemoglobin 13.0 - 17.0 g/dL 12.6  12.4     12.3   Hematocrit 39.0 - 52.0 % 37.5  38     37.6   Platelets 150.0 - 400.0 K/uL 100.0  104     93.0      This result is from an external source.      Latest Ref Rng & Units 12/06/2021   11:57 AM 08/31/2021   12:00 AM 06/29/2021    2:30 PM  CMP  Glucose 70 - 99 mg/dL 108   96   BUN 6 - 23 mg/dL 15  13     13   $ Creatinine 0.40 - 1.50 mg/dL 1.02  0.8     0.88   Sodium 135 - 145 mEq/L 135  130     137   Potassium 3.5 - 5.1 mEq/L 4.4  3.7     4.6   Chloride 96 - 112 mEq/L 105  106     103   CO2 19 - 32 mEq/L 25  26     26   $ Calcium 8.4 - 10.5 mg/dL 9.5  9.1     8.8   Total Protein 6.0 - 8.3 g/dL 6.7   6.5   Total Bilirubin 0.2 - 1.2 mg/dL 0.5   0.6   Alkaline Phos 39 - 117 U/L 88  96     74   AST 0 - 37 U/L 26  37     27   ALT 0 - 53 U/L 22  31     23      $ This result is from  an external source.        Curtis Kneale,MD 07/31/2022, 2:09 PM   CC Dr Delena Bali

## 2022-09-18 ENCOUNTER — Other Ambulatory Visit: Payer: Self-pay

## 2022-09-18 MED ORDER — OMEPRAZOLE 20 MG PO CPDR: 20.0000 mg | DELAYED_RELEASE_CAPSULE | Freq: Every day | ORAL | 11 refills | Status: DC

## 2022-09-18 MED ORDER — POTASSIUM CHLORIDE CRYS ER 20 MEQ PO TBCR: 20.0000 meq | EXTENDED_RELEASE_TABLET | Freq: Two times a day (BID) | ORAL | 11 refills | Status: DC

## 2022-09-20 ENCOUNTER — Other Ambulatory Visit: Payer: Self-pay | Admitting: *Deleted

## 2022-09-20 ENCOUNTER — Ambulatory Visit (AMBULATORY_SURGERY_CENTER): Payer: Medicare Other | Admitting: Gastroenterology

## 2022-09-20 ENCOUNTER — Encounter: Payer: Self-pay | Admitting: Gastroenterology

## 2022-09-20 VITALS — BP 101/58 | HR 47 | Temp 95.9°F | Resp 14 | Ht 63.0 in | Wt 152.0 lb

## 2022-09-20 DIAGNOSIS — K766 Portal hypertension: Secondary | ICD-10-CM | POA: Diagnosis not present

## 2022-09-20 DIAGNOSIS — K2270 Barrett's esophagus without dysplasia: Secondary | ICD-10-CM | POA: Diagnosis not present

## 2022-09-20 DIAGNOSIS — I8510 Secondary esophageal varices without bleeding: Secondary | ICD-10-CM

## 2022-09-20 DIAGNOSIS — K7460 Unspecified cirrhosis of liver: Secondary | ICD-10-CM

## 2022-09-20 DIAGNOSIS — K7031 Alcoholic cirrhosis of liver with ascites: Secondary | ICD-10-CM

## 2022-09-20 DIAGNOSIS — K22711 Barrett's esophagus with high grade dysplasia: Secondary | ICD-10-CM | POA: Diagnosis not present

## 2022-09-20 DIAGNOSIS — K7682 Hepatic encephalopathy: Secondary | ICD-10-CM

## 2022-09-20 DIAGNOSIS — K22710 Barrett's esophagus with low grade dysplasia: Secondary | ICD-10-CM | POA: Diagnosis not present

## 2022-09-20 MED ORDER — LACTULOSE 10 GM/15ML PO SOLN: ORAL | 11 refills | Status: DC

## 2022-09-20 MED ORDER — OMEPRAZOLE 20 MG PO CPDR: 20.0000 mg | DELAYED_RELEASE_CAPSULE | Freq: Every day | ORAL | 11 refills | Status: AC

## 2022-09-20 MED ORDER — SODIUM CHLORIDE 0.9 % IV SOLN
500.0000 mL | INTRAVENOUS | Status: DC
Start: 2022-09-20 — End: 2022-09-20

## 2022-09-20 MED ORDER — POTASSIUM CHLORIDE CRYS ER 20 MEQ PO TBCR: 20.0000 meq | EXTENDED_RELEASE_TABLET | Freq: Two times a day (BID) | ORAL | 11 refills | Status: DC

## 2022-09-20 NOTE — Progress Notes (Signed)
Vss nad trans to pacu 

## 2022-09-20 NOTE — Progress Notes (Signed)
Called to room to assist during endoscopic procedure.  Patient ID and intended procedure confirmed with present staff. Received instructions for my participation in the procedure from the performing physician.  

## 2022-09-20 NOTE — Op Note (Signed)
Curtis Mullins Patient Name: Curtis Mullins Procedure Date: 09/20/2022 1:30 PM MRN: [id number] Endoscopist: Jackquline Denmark , MD, [id number] Age: 77 Referring MD:  Date of Birth: August 12, 1945 Gender: Male Account #: [id number] Procedure:                Upper GI endoscopy Indications:              To evaluate esophageal varices in patient with                            suspected portal hypertension Medicines:                Monitored Anesthesia Care Procedure:                Pre-Anesthesia Assessment:                           - Prior to the procedure, a History and Physical                            was performed, and patient medications and                            allergies were reviewed. The patient's tolerance of                            previous anesthesia was also reviewed. The risks                            and benefits of the procedure and the sedation                            options and risks were discussed with the patient.                            All questions were answered, and informed consent                            was obtained. Prior Anticoagulants: The patient has                            taken no anticoagulant or antiplatelet agents. ASA                            Grade Assessment: III - A patient with severe                            systemic disease. After reviewing the risks and                            benefits, the patient was deemed in satisfactory                            condition to undergo the procedure.  After obtaining informed consent, the endoscope was                            passed under direct vision. Throughout the                            procedure, the patient's blood pressure, pulse, and                            oxygen saturations were monitored continuously. The                            Olympus Scope [id number] was introduced through the                            mouth, and advanced to the  second part of duodenum.                            The upper GI endoscopy was accomplished without                            difficulty. The patient tolerated the procedure                            well. Scope In: Scope Out: Findings:                 Grade I varices were found in the lower third of                            the esophagus. They were 4 mm in largest diameter.                            Too small for EVL                           Small hiatal hernia was noted. Short segment                            Barrett's esophagus (previously biopsy-proven). A                            small subtle 1 cm masslike lesion away from the                            varices at the most proximal Barrett's margin was                            visualized. This was examined by NBI. One Bx was                            carefully performed directed by NBI. It did ooze.  The area was observed until bleeding had completely                            stopped.                           Mild portal hypertensive gastropathy was found in                            the gastric body. No fundal varices.                           The examined duodenum was normal. Complications:            No immediate complications. Estimated Blood Loss:     Estimated blood loss: none. Impression:               - Grade I esophageal varices. Too small for EVL                           - Short-segment Barrett's esophagus with a subtle                            lesion- R/O Ca. Biopsied x 1 .                           - Small hiatal hernia.                           - Portal hypertensive gastropathy- mild. Recommendation:           - Patient has a contact number available for                            emergencies. The signs and symptoms of potential                            delayed complications were discussed with the                            patient. Return to normal activities  tomorrow.                            Written discharge instructions were provided to the                            patient.                           - Resume previous diet.                           - Continue present medications including omeprazole                            20 mg p.o. daily.                           -  Needs refills on lactulose and potassium.                           - Await pathology results.                           - The findings and recommendations were discussed                            with the patient's family. Jackquline Denmark, MD 09/20/2022 1:52:49 PM This report has been signed electronically.

## 2022-09-20 NOTE — Progress Notes (Signed)
IMPRESSION and PLAN:    #1. [place] Stage II s/p Y90 embolization 02/15/2021 with excellent results.  No progression or recurrence MRI 05/2022. Not a candidate for liver transplant d/t age, multiple comorbidities. AFP 12 to 5.5 to 1.9 (05/2022). MRI 11/2021 with improvement in liver lesion. Neg MRI with contrast 05/2022.  #2. 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 Liver Bx 01/2005-grade 3, stage IV cirrhosis.  Complicated by portal hypertension (splenomegaly, thrombocytopenia/pancytopenia, esophageal varices with history of bleeding status post EVL, ascites and hepatic encephalopathy).  #3.  EV s/p EVL 2007, Most recent EGD 02/2013-small EV, too small for EVL, small fundal varices, small HH, mild pHTN gastropathy, short segment Barrett's esophagus.  #4.  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 Loletha Grayer CT was done for [facility] screening).  Patient does not want to pursue any further work-up/treatment.     Plan: -EGD today  HPI:    Chief Complaint:   77yr old Curtis Mullins s/p Y90 embolization 02/15/2021 Underlying ETOH liver cirrhosis with portal hypertension Being followed at [facility] (Dr Enis Gash and Dr. Estelle Grumbles).  Not a candidate for liver transplant d/t age, multiple comorbidities.  For FU Doing very well MRI 05/2022-no recurrence or progression.  Better than MRI with contrast 11/2021.  No PV thrombosis.  AFP has improved and is 1.9 now 05/2022.  He denies having any GI complaints.  No nausea, vomiting, heartburn, regurgitation, odynophagia or dysphagia.  No constipation.  No melena or hematochezia. No unintentional weight loss. No abdominal pain.  Has been taking lactulose every day with resultant 2-3 soft BMs per day.   He did not qualify for rifaximin compassionate program as he "makes too much money"    ONCOLOGY HISTORY  [place], stage II 11/19/20 MRI abdomen shows exophytic segment 2 lesion measuring up to 2.4 cm with peripheral  arterial hyperenhancement, probable washout on delayed images, and enhancing capsule. LR-5 12/01/20 CT chest shows Clustered nodules of the left lower lobe, likely infectious or inflammatory, but no evidence of metastatic disease.  12/09/20 Bone scan whole body with no definite scintigraphic evidence of osseous metastatic disease  11/30/20 AFP 12.8 No progression or recurrence MRI 05/2022, AFP 1.9   MRI 11/29/2021 1. Previously described lesion of concern in segment 2 of the liver is smaller than prior examinations and has indeterminate imaging characteristics. This lesion currently measures 1.5 x 1.4 x 1.5 cm, is centrally hypovascular, and demonstrates some progressive peripheral enhancement, categorized as LR-3 on today's study, but favored to be benign given the lack of diffusion restriction and interval regression of the lesion. Repeat abdominal MRI with and without IV gadolinium is recommended in 12 months to re-evaluate this lesion. 2. Cirrhosis. 3. Cholelithiasis without evidence of acute cholecystitis. 4. Multiple Bosniak class 1 and Bosniak class 2 cysts in the kidneys, as above. 5. Aortic atherosclerosis.    MRI liver with and without contrast 06/01/2022 1. Continued contraction of posttreatment changes of segment 2 hepatocellular carcinoma. A few foci of faintly enhancing peripheral nodular foci are present, overall similar in appearance dating back to 07/05/2021. LR-TR Equivocal. 2. No new arterially enhancing observations. 3. Cirrhosis with sequela of portal hypertension including recanalized paraumbilical vein. 4. Cholelithiasis. 5.  Aortic Atherosclerosis (ICD10-I70.0).  Past Medical History:  Diagnosis Date   Arthritis    Barrett's esophagus    Blood transfusion without reported diagnosis    CAD (coronary artery disease)    Cancer    Cataract  Cirrhosis    COPD (chronic obstructive pulmonary disease)    Esophageal varices    GERD (gastroesophageal reflux  disease)    Heart attack 2000   Hepatic encephalopathy    Hypercholesteremia    Hypertension     Current Outpatient Medications  Medication Sig Dispense Refill   carvedilol (COREG) 12.5 MG tablet Take 1 tablet (12.5 mg total) by mouth 2 (two) times daily. 60 tablet 11   enalapril (VASOTEC) 5 MG tablet Take 2.5 mg by mouth 2 (two) times daily.      furosemide (LASIX) 40 MG tablet TAKE 1 TABLET BY MOUTH DAILY 30 tablet 11   lactulose (CHRONULAC) 10 GM/15ML solution Take 30 ml by mouth twice daily. (Aim 2-3 Bowel movements a day) 1800 mL 11   Multiple Vitamin (MULTIVITAMIN) capsule Take 1 capsule by mouth daily.      Omega-3 1000 MG CAPS Take 3 capsules by mouth daily.      omeprazole (PRILOSEC) 20 MG capsule Take 1 capsule (20 mg total) by mouth daily. 30 capsule 11   potassium chloride SA (KLOR-CON M) 20 MEQ tablet Take 1 tablet (20 mEq total) by mouth 2 (two) times daily. 60 tablet 11   temazepam (RESTORIL) 30 MG capsule Take 30 mg by mouth at bedtime as needed.     traMADol (ULTRAM) 50 MG tablet Take 50 mg by mouth every 6 (six) hours as needed.      Cod Liver Oil CAPS Take 1 capsule by mouth daily.     nitroGLYCERIN (NITROSTAT) 0.4 MG SL tablet Place 1 tablet (0.4 mg total) under the tongue every 5 (five) minutes as needed. 25 tablet 6   Current Facility-Administered Medications  Medication Dose Route Frequency Provider Last Rate Last Admin   0.9 %  sodium chloride infusion  500 mL Intravenous Continuous Jackquline Denmark, MD        Family History  Problem Relation Age of Onset   Cancer Brother        mouth per patient    Colon cancer Neg Hx    Esophageal cancer Neg Hx    Rectal cancer Neg Hx    Stomach cancer Neg Hx     Social History   Tobacco Use   Smoking status: Former   Smokeless tobacco: Former   Tobacco comments:    quit smoking in 2000   Vaping Use   Vaping Use: Never used  Substance Use Topics   Alcohol use: Not Currently    Comment: quit in 2006   Drug use:  Not Currently    Allergies  Allergen Reactions   Codeine      Review of Systems: All systems reviewed and negative except where noted in HPI.    Physical Exam:     BP (!) 153/78   Pulse (!) 49   Temp (!) 95.9 F (35.5 C) (Skin)   Resp 12   Ht 5\' 3"  (1.6 m)   Wt 152 lb (68.9 kg)   SpO2 99%   BMI 26.93 kg/m   GENERAL:  Alert, oriented, cooperative, not in acute distress. PSYCH: :Pleasant, normal mood and affect. HEENT:  conjunctiva pink, mucous membranes moist, neck supple without masses. No jaundice. CARDIAC:  S1 S2 normal. No murmers. PULM: Normal respiratory effort, lungs CTA bilaterally, no wheezing. ABDOMEN: Inspection: No visible peristalsis, no abnormal pulsations, skin normal.  Palpation/percussion: Soft, nontender, nondistended, no rigidity, no abnormal dullness to percussion, no hepatosplenomegaly and no palpable abdominal masses.  Auscultation: Normal bowel sounds, no  abdominal bruits. Rectal exam: Deferred SKIN:  turgor, no lesions seen. Musculoskeletal:  Normal muscle tone, normal strength. NEURO: Alert and oriented x 3, no focal neurologic deficits.    Latest Ref Rng & Units 12/06/2021   11:57 AM 08/31/2021   12:00 AM 06/29/2021    2:30 PM  CBC  WBC 4.0 - 10.5 K/uL 2.3 Repeated and verified X2.  2.7     2.2 Repeated and verified X2.   Hemoglobin 13.0 - 17.0 g/dL 12.6  12.4     12.3   Hematocrit 39.0 - 52.0 % 37.5  38     37.6   Platelets 150.0 - 400.0 K/uL 100.0  104     93.0      This result is from an external source.      Latest Ref Rng & Units 12/06/2021   11:57 AM 08/31/2021   12:00 AM 06/29/2021    2:30 PM  CMP  Glucose 70 - 99 mg/dL 108   96   BUN 6 - 23 mg/dL 15  13     13    Creatinine 0.40 - 1.50 mg/dL 1.02  0.8     0.88   Sodium 135 - 145 mEq/L 135  130     137   Potassium 3.5 - 5.1 mEq/L 4.4  3.7     4.6   Chloride 96 - 112 mEq/L 105  106     103   CO2 19 - 32 mEq/L 25  26     26    Calcium 8.4 - 10.5 mg/dL 9.5  9.1     8.8   Total  Protein 6.0 - 8.3 g/dL 6.7   6.5   Total Bilirubin 0.2 - 1.2 mg/dL 0.5   0.6   Alkaline Phos 39 - 117 U/L 88  96     74   AST 0 - 37 U/L 26  37     27   ALT 0 - 53 U/L 22  31     23       This result is from an external source.        Curtis Betzler,MD 09/20/2022, 1:35 PM   CC Dr Delena Bali

## 2022-09-20 NOTE — Patient Instructions (Addendum)
Refills for lactulose, omeprazole and potassium sent to pharmacy.  Continue present medications including omeprazole 20 mg daily.  YOU HAD AN ENDOSCOPIC PROCEDURE TODAY AT [facility]:   Refer to the procedure report that was given to you for any specific questions about what was found during the examination.  If the procedure report does not answer your questions, please call your gastroenterologist to clarify.  If you requested that your care partner not be given the details of your procedure findings, then the procedure report has been included in a sealed envelope for you to review at your convenience later.  YOU SHOULD EXPECT: Some feelings of bloating in the abdomen. Passage of more gas than usual.  Walking can help get rid of the air that was put into your GI tract during the procedure and reduce the bloating. If you had a lower endoscopy (such as a colonoscopy or flexible sigmoidoscopy) you may notice spotting of blood in your stool or on the toilet paper. If you underwent a bowel prep for your procedure, you may not have a normal bowel movement for a few days.  Please Note:  You might notice some irritation and congestion in your nose or some drainage.  This is from the oxygen used during your procedure.  There is no need for concern and it should clear up in a day or so.  SYMPTOMS TO REPORT IMMEDIATELY:  Following upper endoscopy (EGD)  Vomiting of blood or coffee ground material  New chest pain or pain under the shoulder blades  Painful or persistently difficult swallowing  New shortness of breath  Fever of 100F or higher  Black, tarry-looking stools  For urgent or emergent issues, a gastroenterologist can be reached at any hour by calling [phone]. Do not use MyChart messaging for urgent concerns.    DIET:  We do recommend a small meal at first, but then you may proceed to your regular diet.  Drink plenty of fluids but you should avoid alcoholic beverages  for 24 hours.  ACTIVITY:  You should plan to take it easy for the rest of today and you should NOT DRIVE or use heavy machinery until tomorrow (because of the sedation medicines used during the test).    FOLLOW UP: Our staff will call the number listed on your records the next business day following your procedure.  We will call around 7:15- 8:00 am to check on you and address any questions or concerns that you may have regarding the information given to you following your procedure. If we do not reach you, we will leave a message.     If any biopsies were taken you will be contacted by phone or by letter within the next 1-3 weeks.  Please call us at [phone] if you have not heard about the biopsies in 3 weeks.    SIGNATURES/CONFIDENTIALITY: You and/or your care partner have signed paperwork which will be entered into your electronic medical record.  These signatures attest to the fact that that the information above on your After Visit Summary has been reviewed and is understood.  Full responsibility of the confidentiality of this discharge information lies with you and/or your care-partner.

## 2022-09-20 NOTE — Progress Notes (Signed)
Pt. states no medical or surgical changes since previsit or office visit. 

## 2022-09-21 ENCOUNTER — Telehealth: Payer: Self-pay | Admitting: *Deleted

## 2022-09-21 NOTE — Telephone Encounter (Signed)
  Follow up Call-     09/20/2022    1:00 PM  Call back number  Post procedure Call Back phone  # [phone]  Permission to leave phone message Yes     Patient questions:  Do you have a fever, pain , or abdominal swelling? No. Pain Score  0 *  Have you tolerated food without any problems? Yes.  Have you been able to return to your normal activities? Yes.  Do you have any questions about your discharge instructions: Diet   No. Medications  No. Follow up visit  No.  Do you have questions or concerns about your Care? No.  Actions: * If pain score is 4 or above: No action needed, pain <4.

## 2022-10-08 NOTE — Progress Notes (Signed)
I called again. I have discussed in detail with patient's daughter over the phone. I have also texted Dr. Cornelia Copa at [facility] .  Will wait for their opinion and then decide further. If he requires any further treatment for esophageal Barrett's nodule with high-grade dysplasia, that has to be done at [facility]

## 2022-10-23 ENCOUNTER — Telehealth: Payer: Self-pay

## 2022-10-23 NOTE — Telephone Encounter (Signed)
Needs appt with Dr Cornelia Copa at [facility] for FU Received: Brett Canales, MD  Emeline Darling, RN Viviann Spare,  Pl make sure he has appt with DR Cornelia Copa at [facility] for FU I had discussed with her over text messaging.  RG

## 2022-10-23 NOTE — Telephone Encounter (Signed)
Dr. Cornelia Copa office was contacted and spoke with [facility]. Curtis Mullins stated that pt does not have a Follow up but she will call him to schedule him.  Pt phone number confirmed,  Pt daughter phone number provided also.

## 2022-11-30 ENCOUNTER — Ambulatory Visit (HOSPITAL_COMMUNITY)
Admission: RE | Admit: 2022-11-30 | Discharge: 2022-11-30 | Disposition: A | Discharge status: Home / Self Care | Payer: Medicare Other | Source: Ambulatory Visit | Attending: Gastroenterology | Admitting: Gastroenterology

## 2022-11-30 DIAGNOSIS — K7460 Unspecified cirrhosis of liver: Secondary | ICD-10-CM | POA: Insufficient documentation

## 2022-11-30 DIAGNOSIS — C220 Liver cell carcinoma: Secondary | ICD-10-CM | POA: Insufficient documentation

## 2022-11-30 DIAGNOSIS — I701 Atherosclerosis of renal artery: Secondary | ICD-10-CM

## 2022-11-30 MED ORDER — GADOBUTROL 1 MMOL/ML IV SOLN
7.0000 mL | Freq: Once | INTRAVENOUS | Status: AC | PRN
  Administered 2022-11-30: 7 mL via INTRAVENOUS

## 2022-12-05 ENCOUNTER — Telehealth: Payer: Self-pay | Admitting: *Deleted

## 2022-12-05 NOTE — Telephone Encounter (Signed)
Patient notified his MRI looks better as per Dr. Chales Abrahams. Also notified patient as soon as we are able to establish an appt, he will be notified. MRI report is not noted in chart as yet. Report will be sent to the patients PCP as soon as noted.

## 2022-12-05 NOTE — Telephone Encounter (Signed)
-----   Message from Chrystie Nose, RN sent at 12/05/2022 10:28 AM EDT -----  ----- Message ----- From: Emeline Darling, RN Sent: 12/05/2022  12:00 AM EDT To: Emeline Darling, RN  Lynann Bologna, MD  Emeline Darling, RN Please inform the patient.  MRI looks better  Repeat MRI abdomen with and without contrast in 1 year  We will discuss more when he comes for follow-up next week  Send report to family physician   Sent 12/04/2021

## 2022-12-06 NOTE — Telephone Encounter (Signed)
Official report for MRI still pending Will wait for official radiology report  I have talked to patient's wife Hattie.  Unfortunately, she is in the ED currently d/t cellulitis Curtis Mullins has follow-up with Dr. Huston Foley mid July. He does not have any dysphagia  Dr. Huston Foley is aware of recent esophageal biopsies showing high-grade dysplasia with underlying Barrett's. If any intervention is to be done for the esophagus, it has to be at [facility] since he has underlying varices.  RG

## 2022-12-07 ENCOUNTER — Other Ambulatory Visit: Payer: Self-pay | Admitting: Oncology

## 2022-12-07 ENCOUNTER — Inpatient Hospital Stay: Payer: Medicare Other | Attending: Oncology | Admitting: Oncology

## 2022-12-07 VITALS — BP 128/80 | HR 80 | Temp 97.7°F | Resp 18 | Ht 63.0 in | Wt 145.4 lb

## 2022-12-07 DIAGNOSIS — C220 Liver cell carcinoma: Secondary | ICD-10-CM

## 2022-12-07 NOTE — Progress Notes (Signed)
[facility]  [address] [place],  [place]  [address] [phone]  Clinic Day:  12/07/2022  Referring physician: Alveria Apley, NP   HISTORY OF PRESENT ILLNESS:  The patient is a 77 y.o. male  with hepatocellular carcinoma.  The patient has had underlying cirrhosis since 2006.  He underwent Y-90 chemoembolization to his one focus of HCC in August 2022.  He comes in today to go over his most recent abdominal MRI for his hepatocellular carcinoma surveillance.  Overall, the patient is doing fairly well.  He continues to deny having right upper quadrant pain, nausea, jaundice, or other symptoms/findings which concern him for disease recurrence/progression.    PHYSICAL EXAM:  Blood pressure 128/80, pulse 80, temperature 97.7 F (36.5 C), resp. rate 18, height 5\' 3"  (1.6 m), weight 145 lb 6.4 oz (66 kg), SpO2 96 %. Wt Readings from Last 3 Encounters:  12/07/22 145 lb 6.4 oz (66 kg)  09/20/22 152 lb (68.9 kg)  07/31/22 152 lb (68.9 kg)   Body mass index is 25.76 kg/m. Performance status (ECOG): 1 - Symptomatic but completely ambulatory Physical Exam Constitutional:      Appearance: Normal appearance. He is not ill-appearing.  HENT:     Mouth/Throat:     Mouth: Mucous membranes are moist.     Pharynx: Oropharynx is clear. No oropharyngeal exudate or posterior oropharyngeal erythema.  Cardiovascular:     Rate and Rhythm: Normal rate and regular rhythm.     Heart sounds: No murmur heard.    No friction rub. No gallop.  Pulmonary:     Effort: Pulmonary effort is normal. No respiratory distress.     Breath sounds: Normal breath sounds. No wheezing, rhonchi or rales.  Abdominal:     General: Bowel sounds are normal. There is no distension.     Palpations: Abdomen is soft. There is no mass.     Tenderness: There is no abdominal tenderness.  Musculoskeletal:        General: No swelling.     Right lower leg: No edema.     Left lower leg: No  edema.  Lymphadenopathy:     Cervical: No cervical adenopathy.     Upper Body:     Right upper body: No supraclavicular or axillary adenopathy.     Left upper body: No supraclavicular or axillary adenopathy.     Lower Body: No right inguinal adenopathy. No left inguinal adenopathy.  Skin:    General: Skin is warm.     Coloration: Skin is not jaundiced.     Findings: No lesion or rash.  Neurological:     General: No focal deficit present.     Mental Status: He is alert and oriented to person, place, and time. Mental status is at baseline.  Psychiatric:        Mood and Affect: Mood normal.        Behavior: Behavior normal.        Thought Content: Thought content normal.    SCANS:  His abdominal MRI done in June 2024 revealed the following: FINDINGS: Lower chest: Lung bases are essentially clear.  Hepatobiliary: Cirrhosis. Post treatment changes in the medial segment left hepatic lobe. Associated 11 mm hypoenhancing lesion (series 19/image 25), previously 15 mm, likely reflecting contraction of the patient's ablation zone. No residual enhancement to suggest viable tumor.  Cholelithiasis, without associated inflammatory changes. No intrahepatic or extrahepatic ductal dilatation.  Pancreas: Within normal limits.  Spleen: Within normal limits.  Adrenals/Urinary Tract: Adrenal glands are within normal limits.  Segmental right upper pole renal cortical atrophy with bilateral renal cysts, including a dominant 4.8 cm irregular cyst in the medial right upper pole (series 5/image 13), benign (Bosniak II). Additional simple bilateral renal cysts, benign (Bosniak I). No follow-up is recommended.  No hydronephrosis.  Stomach/Bowel: Stomach is within normal limits.  Visualized bowel is grossly unremarkable.  Vascular/Lymphatic: No evidence of abdominal aortic aneurysm. Portal vein is patent. Recanalized periumbilical vein.  No suspicious abdominal lymphadenopathy.  Other: No  abdominal ascites.  Musculoskeletal: No focal osseous lesions.  IMPRESSION: Post treatment changes in the medial segment left hepatic lobe, as described above, without findings to suggest viable tumor. Cirrhosis.  Cholelithiasis, without associated inflammatory changes.  Bilateral renal cysts, as described above, benign (Bosniak I-II). No follow-up is recommended.  ASSESSMENT & PLAN:  A 77 y.o. male with stage IB (T1b N0 M0) hepatocellular carcinoma, status post Y90 chemoembolization in late August 2022.  In clinic today, went over his abdominal MRI images with him, which continue to show no evidence of disease recurrence.  Overall, it appears the patient remains disease-free.  Based upon this, I will see him back in another 6 months for repeat clinical assessment.  His alpha-fetoprotein will be checked at that time to ensure there is no biochemical evidence of disease progression.  As his liver cancer appears to be under ideal control, I will begin spacing his abdominal MRIs out to once per year. The patient understands all the plans discussed today and is in agreement with them.   Donelle Baba Kirby Funk, MD

## 2022-12-10 ENCOUNTER — Telehealth: Payer: Self-pay | Admitting: Oncology

## 2022-12-10 NOTE — Telephone Encounter (Signed)
Contacted pt to schedule an appt. Unable to reach via phone, voicemail was left.   Message Header  Department: [facility] [[id number]]   Scheduling Message Entered by Rennis Harding A on 12/07/2022 at  2:43 PM Priority: Routine <No visit type provided>  Department: [facility]  Provider:  Scheduling Notes:  Labs 06-07-23  Appt 06-10-23

## 2022-12-13 NOTE — Telephone Encounter (Signed)
Pt's spouse will have pt give [place] a call to schedule

## 2022-12-14 NOTE — Progress Notes (Signed)
Please inform the patient. MRI - looks good. No significant tumor He must have appointment with Dr. 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 Send report to family physician

## 2023-01-07 NOTE — Progress Notes (Signed)
 Hepatology Clinic Follow Up Patient Evaluation Note    SUBJECTIVE: History of Present Illness: Curtis Mullins is a 77 y.o. male  who presents for ongoing management of alcoholic cirrhosis.  His liver disease was complicated by hepatocellular carcinoma (HCC) that was treated by Y90 embolization on 02/15/2021.  He follows with local GI physician for hepatology care at [facility] and sees oncologist locally twice per year.   He underwent EGD recently in 09/2022 for EV surveillance and follow up of Barrett's esophagus. He was found to have nodular lesion and biopsy suggestive of BE with HGD (path not available), he was referred back to us  for further management.  Curtis Mullins currently denies abdominal pain, nausea and vomiting, odynophagia, dysphagia. He denies GI bleeding, ascites, edema, jaundice, pruritis, and confusion.   He takes lactulose  once per day with 3-4 BM per day. He is taking omeprazole  20 mg daily.    Patient Active Problem List  Diagnosis  . 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 Portal hypertension (CMS-HCC)  . Alcoholic cirrhosis of liver (CMS/HHS-HCC)  . Overweight(278.02)  . HCC (hepatocellular carcinoma) (CMS/HHS-HCC)    Past Medical History:  Diagnosis Date  . Alcohol problem drinking    former problem; last consumed 09/2004  . Alcoholic cirrhosis (CMS/HHS-HCC)   . COPD (chronic obstructive pulmonary disease) (CMS/HHS-HCC)   . Epistaxis   . GERD (gastroesophageal reflux disease)   . History of myocardial infarction   . IHD (ischemic heart disease)   . Weight gain    Past Surgical History:  Procedure Laterality Date  . HERNIA REPAIR Left 1978   inguinal  . COLONOSCOPY    . EGD    . LIVER BIOPSY    . s/p band ligation therapy      bleeding esophageal varices   Family History  Problem Relation Name Age of Onset  . Lung cancer Mother    . Lung cancer Father    . Cancer Brother    . Cirrhosis Neg Hx    . HCC Neg Hx    . Liver disease Neg Hx      Social History   Tobacco Use  . Smoking status: Former    Current packs/day: 0.00    Types: Cigarettes    Start date: 10/01/1958    Quit date: 10/01/1998    Years since quitting: 24.2  . Smokeless tobacco: Never  Substance Use Topics  . Alcohol use: No    Comment: None since 2006.     Allergies  Allergen Reactions  . Codeine Itching and Rash     Medications:  No outpatient medications have been marked as taking for the 01/07/23 encounter (Office Visit) with Nola Tobias Brunt, MD.    Review of Systems:  Constitutional: negative Eyes: negative Ears, nose, mouth, throat, and face: negative Respiratory: negative Cardiovascular: negative Gastrointestinal: negative Genitourinary:negative Musculoskeletal: negative Neurological: negative Hematologic/lymphatic: negative   OBJECTIVE:  Physical Exam: There were no vitals filed for this visit.  Ht:  Wt:65.3 kg (143 lb 15.4 oz) ADJ:Anib surface area is 1.7 meters squared. Body mass index is 25.51 kg/m.  General appearance: alert and cooperative Eyes:  anicteric sclera Neck: supple, symmetrical, trachea midline  Back: symmetric, nontender Lungs: clear to auscultation bilaterally Heart: regular rate and rhythm Abdomen: soft, non-tender; bowel sounds normal; no masses, no clinical evidence of ascites Extremities: no edema Neurologic: no tremor or asterixis   LABORATORY DATA:    Outside lab testing:        Outside abdominal MRI  performed on 16/05/2022 FINDINGS:  Lower chest: Unremarkable.   Hepatobiliary: Liver has a severely shrunken appearance and nodular  contour, indicative of advanced cirrhosis. The lesion of concern in  the left lobe of the liver located in segment 2 adjacent to the  falciform ligament (axial image 20 of series 30) currently measures  1.5 x 1.4 x 1.5 cm (previously 2.6 x 2.0 x 1.6 cm and is slightly  low T1 signal intensity, isointense on T2 weighted images, centrally   hypovascular with low-level peripheral enhancement on early post  gadolinium imaging, which becomes more defined and thicker on  delayed post gadolinium images. This lesion does not restrict  diffusion. No other suspicious appearing hepatic lesions are noted.  No intra or extrahepatic biliary ductal dilatation. 1 cm filling  defect lying dependently in the gallbladder, compatible with a  gallstone. Gallbladder is moderately distended. Gallbladder wall  thickness is normal. No pericholecystic fluid or surrounding  inflammatory changes.   Pancreas: No pancreatic mass. No pancreatic ductal dilatation. No  pancreatic or peripancreatic fluid collections or inflammatory  changes.   Spleen: Unremarkable.   Adrenals/Urinary Tract: Again noted are multiple renal lesions  bilaterally, similar in size and number to the prior examination,  which are T1 hypointense, T2 hyperintense and do not enhance,  compatible with cysts. The majority of these are simple in  appearance, although some lesions in the upper pole of the right  kidney demonstrate internal thin septations (Bosniak class 2). The  largest of these lesions in the upper pole of the right kidney  measures up to 7.5 cm in diameter. No aggressive appearing renal  lesions. No hydroureteronephrosis in the visualized portions of the  abdomen. Bilateral adrenal glands are normal in appearance.   Stomach/Bowel: Visualized portions are unremarkable.   Vascular/Lymphatic: Extensive aortic atherosclerosis. No aneurysm  identified in the visualized abdominal vasculature. No  lymphadenopathy noted in the abdomen.   Other: No significant volume of ascites and no pneumoperitoneum  noted in the visualized portions of the skeleton.   Musculoskeletal: No aggressive appearing osseous lesions are noted  in the visualized portions of the skeleton. Dextroscoliosis of the  lumbar spine.   IMPRESSION:  1. Previously described lesion of concern in  segment 2 of the liver  is smaller than prior examinations and has indeterminate imaging  characteristics. This lesion currently measures 1.5 x 1.4 x 1.5 cm,  is centrally hypovascular, and demonstrates some progressive  peripheral enhancement, categorized as LR-3 on today's study, but  favored to be benign given the lack of diffusion restriction and  interval regression of the lesion. Repeat abdominal MRI with and  without IV gadolinium is recommended in 12 months to re-evaluate  this lesion.  2. Cirrhosis.  3. Cholelithiasis without evidence of acute cholecystitis.  4. 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  5. Aortic atherosclerosis.    ASSESSMENT / RECOMMENDATIONS:  Curtis Mullins lacking clinical and biochemical features that would be concerning for a decline in liver function.  However, he completed embolization for hepatocellular carcinoma (HCC) in August 2022.  He has been evaluated by [facility] oncologist, Dr Devito but now receives his oncologic care with a local oncologist, Dr Ezzard.   He has undergone repeat imaging of his liver, which occurred locally in June 2023.  Review of the report from this study suggests that there is no viable tumor or new tumor present. He follows with oncologist locally twice per year.  I reviewed with Curtis. Mullins previously that that his age in conjunction with significant medical issues are concerning for candidacy for liver transplant.  He is noted to be of older age and with medical comorbidities (COPD, ischemic heart disease).  These factors would not make him a good candidate for liver transplantation.     Main new issue is findings of BE with HGD from biopsy of nodular lesion on EGD done locally in 09/2022. We will plan to have slides sent over to [facility] for review and additionally refer to our esopahgeal colleagues at [facility] for further management.  Otherwise, we discussed that he is  continuing to see his local gastroenterologist, Dr. Charlanne.  Curtis. Mullins indicated that travel from his home to [place] is increasingly challenging for him, and he is interested in keeping all of his liver-related care locally.  This is reasonable for now, and we discussed that he could come back to this liver clinic on an as needed basis and contact my office if with any questions in the future.  He is agreeable to this plan and will continue follow up with his local doctors.  Recommendations:  -  Volume: Continue lasix  40 mg daily  - EV Surveillance: Small EV 09/2022  - Recommended discontinuing enalapril 20 mg daily (avoid ACEi in cirrhosis)  - Continue coreg  12.5 mg BID (Currently taking 12.5 mg once daily d/t low Bps per patient. Discussed that if low Bps on coreg  12.5 mg BID despite stopping enalapril, then can decrease coreg  to 6.25 mg BID) - HCC Surveillance: MRI w/wo neg for focal lesiosn 11/30/2022, next due in  4mo - Nutrition: 2GM sodium diet; 1-1.5G/KG protein daily - HE PPX: continue lactulose  - SBP PPX:  N/A - We will plan to have esophageal nodule biopsy slides sent over to [facility] for review and additionally refer to our esopahgeal colleagues at [facility] for further management.  Athidi G. Earasi, MD Transplant Hepatology Fellow PGY-7 [facility]  Attestation Statement:   I personally saw and evaluated the patient, and participated in the management and treatment plan as documented in the resident/fellow note.  Curtis Mullins lacks clinical and biochemical features that would be concerning for a decline in liver function (see lab addendum below).  He recently completed an EGD for which he was found to have grade 1 esophageal varices with Barrett's esophagus and an esophageal nodule that was biopsied and found to represent high grade dysplasia in the setting of Barrett's esophagus.  We will refer him to esophageal specialists at [facility] within the GI division and obtain the path slides  from the esophageal biopsies for confirmation of findings with [facility].   Will also obtain images from his recent outside abdominal MRI for formal review by [facility] to ensure no concerns for recurrent HCC.  He desires to continue his liver related care through his local GI provider, Dr. Charlanne, and continue his HCC-related care through his local oncologist, Dr. Valaria Kerns, both of whom he continues to see routinely.  He desires to return to [facility] on an as needed basis.    TOBIAS LELON CLEAVES, MD  Lab addendum Office Visit on 01/07/2023  Component Date Value Ref Range Status  . Sodium 01/07/2023 140  135 - 145 mmol/L Final  . Potassium 01/07/2023 4.0  3.5 - 5.0 mmol/L Final  . Chloride 01/07/2023 106  98 - 108 mmol/L Final  . Carbon Dioxide (CO2) 01/07/2023 23  21 - 30 mmol/L Final  .  Urea  Nitrogen (BUN) 01/07/2023 20  7 - 20 mg/dL Final  . Creatinine 92/77/7975 1.2  0.6 - 1.3 mg/dL Final  . Glucose 92/77/7975 115  70 - 140 mg/dL Final   Interpretive Data: Above is the NONFASTING reference range.   Below are the FASTING reference ranges:  NORMAL:      70-99 mg/dL  PREDIABETES: 899-874 mg/dL  DIABETES:    > 874 mg/dL   . Calcium 01/07/2023 9.6  8.7 - 10.2 mg/dL Final  . AST (Aspartate Aminotransferase) 01/07/2023 29  15 - 41 U/L Final  . ALT (Alanine Aminotransferase) 01/07/2023 22  15 - 50 U/L Final  . Bilirubin, Total 01/07/2023 0.9  0.4 - 1.5 mg/dL Final  . Alk Phos (Alkaline Phosphatase) 01/07/2023 69  24 - 110 U/L Final  . Albumin 01/07/2023 3.4 (L)  3.5 - 4.8 g/dL Final  . Protein, Total 01/07/2023 5.8 (L)  6.2 - 8.1 g/dL Final  . Anion Gap 92/77/7975 11  3 - 12 mmol/L Final  . BUN/CREA Ratio 01/07/2023 17  6 - 27 Final  . Glomerular Filtration Rate (eGFR)  01/07/2023 62  mL/min/1.73sq m Final   CKD-EPI (2021) does not include patient's race in the calculation of eGFR. Monitoring changes of plasma creatinine and eGFR over time is useful for monitoring kidney  function.  This change was made on 08/16/2020.  Interpretive Ranges for eGFR(CKD-EPI 2021):   eGFR:              > 60 mL/min/1.73 sq m - Normal  eGFR:              30 - 59 mL/min/1.73 sq m - Moderately Decreased  eGFR:              15 - 29 mL/min/1.73 sq m - Severely Decreased  eGFR:              < 15 mL/min/1.73 sq m -  Kidney Failure   Note: These eGFR calculations do not apply in acute situations  when eGFR is changing rapidly or in patients on dialysis.   . Prothrombin Time 01/07/2023 13.0  9.5 - 13.1 sec Final  . Prothrombin INR 01/07/2023 1.1  0.9 - 1.1 Final   Reference Ranges: DVT/PE/PVD = INR 2.0 - 3.0 Mechanical Heart Valve = INR 2.5 - 3.5  NOTE: The INR is not a PT Ratio and is valid only for Warfarin patients   . WBC (White Blood Cell Count) 01/07/2023 3.8  3.2 - 9.8 x10^9/L Final  . Hemoglobin 01/07/2023 13.1 (L)  13.7 - 17.3 g/dL Final  . Hematocrit 92/77/7975 39.5  39.0 - 49.0 % Final  . Platelets 01/07/2023 95 (L)  150 - 450 x10^9/L Final  . MCV (Mean Corpuscular Volume) 01/07/2023 95  80 - 98 fL Final  . MCH (Mean Corpuscular Hemoglobin) 01/07/2023 31.5  26.5 - 34.0 pg Final  . MCHC (Mean Corpuscular Hemoglobin * 01/07/2023 33.2  31.5 - 36.3 % Final  . RBC (Red Blood Cell Count) 01/07/2023 4.16 (L)  4.37 - 5.74 x10^12/L Final  . RDW-CV (Red Cell Distribution Widt* 01/07/2023 16.5 (H)  11.5 - 14.5 % Final  . NRBC (Nucleated Red Blood Cell Cou* 01/07/2023 0.00  0 x10^9/L Final  . NRBC % (Nucleated Red Blood Cell %) 01/07/2023 0.0  % Final  . MPV (Mean Platelet Volume) 01/07/2023 10.2  7.2 - 11.7 fL Final  . Immature Platelet Fraction 01/07/2023 3.5  1.6 - 8.5 % Final  . Alpha  Fetoprotein (AFP) 01/07/2023 2.3  <9.0 ng/mL Final

## 2023-02-27 ENCOUNTER — Other Ambulatory Visit: Payer: Self-pay

## 2023-02-27 MED ORDER — CARVEDILOL 12.5 MG PO TABS: 12.5000 mg | ORAL_TABLET | Freq: Two times a day (BID) | ORAL | 11 refills | Status: DC

## 2023-04-27 LAB — LAB REPORT - SCANNED
A1c: 5.7
EGFR: 74.5

## 2023-06-07 ENCOUNTER — Inpatient Hospital Stay: Payer: Medicare Other

## 2023-06-10 ENCOUNTER — Inpatient Hospital Stay: Payer: Medicare Other | Admitting: Oncology

## 2023-09-03 LAB — LAB REPORT - SCANNED
EGFR: 76.1
TSH: 1.7 (ref 0.41–5.90)

## 2023-10-30 ENCOUNTER — Other Ambulatory Visit: Payer: Self-pay | Admitting: Gastroenterology

## 2023-11-05 ENCOUNTER — Other Ambulatory Visit: Payer: Self-pay | Admitting: Gastroenterology

## 2023-12-02 ENCOUNTER — Other Ambulatory Visit: Payer: Self-pay | Admitting: Gastroenterology

## 2023-12-02 DIAGNOSIS — K7682 Hepatic encephalopathy: Secondary | ICD-10-CM

## 2023-12-02 DIAGNOSIS — K7031 Alcoholic cirrhosis of liver with ascites: Secondary | ICD-10-CM

## 2023-12-06 ENCOUNTER — Other Ambulatory Visit: Payer: Self-pay

## 2023-12-06 ENCOUNTER — Telehealth: Payer: Self-pay

## 2023-12-06 DIAGNOSIS — Z8505 Personal history of malignant neoplasm of liver: Secondary | ICD-10-CM

## 2023-12-06 NOTE — Telephone Encounter (Signed)
 Pt wife Adele Admire  was made aware of the MRI that was requested. Adele Admire stated that she was currently in the hospital and wish to cancel the MRI for now and that she would call back at  later date to reschedule. Adele Admire was notified that I would schedule a reminder for 2 months to reach back out to her. MRI canceled  Hattie  verbalized understanding with all questions answered.

## 2023-12-06 NOTE — Telephone Encounter (Signed)
 Pt was ordered and scheduled for the MRI on 11/10/2023 at 10:00 AM  at [facility]. Pt to arrive at 9:30 AM. Nothing to eat or drink past midnight.  Left message for pt to call back

## 2023-12-06 NOTE — Telephone Encounter (Signed)
 Patient wife stated that  she was returning a call back.Patient wife is requesting a call back. Please advise.

## 2023-12-06 NOTE — Telephone Encounter (Signed)
-----   Message from Nurse Lenon Radar B sent at 12/05/2022 12:14 PM EDT ----- Patient will need to be scheduled for an MRI ABD W/WO contrast in 1 year.

## 2023-12-11 ENCOUNTER — Ambulatory Visit (HOSPITAL_COMMUNITY)

## 2024-02-05 NOTE — Telephone Encounter (Signed)
 Reminder was received in Epic. Pt wife Johnsie was contacted. Curtis Mullins stated that she was still in rehab and requested to be called back in 1 month in regard to rescheduling the MRI.  Reminder placed in epic for 1 month.

## 2024-02-08 LAB — COMPREHENSIVE METABOLIC PANEL (CC13): EGFR: 66.3

## 2024-03-09 NOTE — Telephone Encounter (Signed)
 Spoke with Pt wife Hattie. Hattie stated that she would contact the pt and have him call our office.

## 2024-03-11 NOTE — Telephone Encounter (Signed)
 Spoke with pt.  Pt notified of Dr. Charlanne  to repeat the MRI from last year. Pt stated that he request to have it done at the [facility]  Order Faxed to [phone] Pt made aware.   Reminder placed in Epic to follow up on order.

## 2024-03-11 NOTE — Telephone Encounter (Signed)
 Inbound call from patient requesting to speak with Elspeth. Patient would for a return call at [phone]. Please advise.

## 2024-03-18 NOTE — Telephone Encounter (Signed)
 Spoke with pt wife Johnsie to confirm that pt has been scheduled for the MRI. Hattie stated that the pt has been scheduled. Johnsie was notified that I willl follow up with her on Monday to confirm if he had the test and then request for the results.  Hattie verbalized understanding with all questions answered.

## 2024-03-23 NOTE — Telephone Encounter (Signed)
 Left message for pt to call back

## 2024-03-24 NOTE — Telephone Encounter (Signed)
 Left message for pt to call back

## 2024-03-25 NOTE — Telephone Encounter (Signed)
 Patients wife called stating that patient is scheduled for 10/14 at 2:00 pm for MRI. Please advise. Thank you

## 2024-03-25 NOTE — Telephone Encounter (Signed)
 Spoke with Pt wife Johnsie in regard to pt imaging.  Hattie stated that they have not called him to schedule. [facility] [phone] Option 7 was called and spoke with Tinnie. Lauren stated that there office has tried 4 times to reach the pt but it appears that there is a block on the phone.  Hattie was notified to call them and schedule the procedure. Phone number provided.  [facility] [phone] Option 7  Pt verbalized understanding with all questions answered.

## 2024-03-25 NOTE — Telephone Encounter (Signed)
 Noted

## 2024-04-01 NOTE — Telephone Encounter (Signed)
 [facility] [phone] was called and spoke with Tinnie.  Lauren stated that the MRI was completed and the  Report not yet final. Reminder placed in epic to follow up.  Lauren stated that the best number to call for the out pt imaging is [phone].

## 2024-04-06 NOTE — Telephone Encounter (Addendum)
 [facility] [phone] was called and requested Fax to be sent over.  Fax of MRI was requested and received,.  Copy placed on Dr. Charlanne desk for review.  Copy sent to be scanned into Epic.  Please review and advise.

## 2024-04-13 ENCOUNTER — Encounter: Payer: Self-pay | Admitting: Gastroenterology

## 2024-04-21 NOTE — Telephone Encounter (Signed)
 MRI liver @ [facility] 04/01/2024 Interval ablation of lesion of the left lobe of liver with changes of the left lower root most consistent with ablation. No definitive residual recurrent mass. Cirrhotic liver with periumbilical varices. Atrophic right kidney with numerous cysts. Cholelithiasis.   Good news. NO definite residual or recurrent masses. [facility]

## 2024-04-22 ENCOUNTER — Encounter: Payer: Self-pay | Admitting: *Deleted

## 2024-04-22 NOTE — Telephone Encounter (Deleted)
 Attempted to contact patient . Left message on answering machine to please return call.

## 2024-04-22 NOTE — Telephone Encounter (Signed)
 Attempted to reach patient.  Left message on answering machine to  please return call to go over results.

## 2024-04-27 NOTE — Telephone Encounter (Signed)
 Pt wife Johnsie made aware of recent results.  Hattie verbalized understanding with all questions answered.

## 2024-05-08 ENCOUNTER — Other Ambulatory Visit: Payer: Self-pay | Admitting: Gastroenterology

## 2024-05-12 LAB — COMPREHENSIVE METABOLIC PANEL (CC13): EGFR: 66.9

## 2024-05-26 ENCOUNTER — Telehealth: Payer: Self-pay

## 2024-05-26 NOTE — Telephone Encounter (Signed)
 Called [facility] at [phone] and left voicemail on nurse line regarding if an AFP was done on this patient

## 2024-05-27 NOTE — Telephone Encounter (Signed)
 Madison from [facility] said they didn't do a AFP when he had lab work done

## 2024-05-27 NOTE — Telephone Encounter (Signed)
 Called back again and was sent to the nurse line and left message again

## 2024-05-28 NOTE — Telephone Encounter (Signed)
 Spoke to patient wife and she will tell her husband to go to [facility] to get the lab done. Lab order faxed to [facility] on their form. Wife wouldn't give me husband number. She is in rehab

## 2024-05-28 NOTE — Telephone Encounter (Signed)
 Lets check AFP. [facility]

## 2024-06-03 NOTE — Telephone Encounter (Signed)
 Refaxed lab work and patient will go back tomorrow. Called outpatient lab and spoke to lisa and they have it

## 2024-06-03 NOTE — Telephone Encounter (Signed)
 Patient called stating [facility] does not have lab orders. Requesting a call back. Please advise, thank you

## 2024-06-05 NOTE — Telephone Encounter (Signed)
 AFP <1.8  Patient's wife made aware

## 2024-06-23 ENCOUNTER — Encounter: Payer: Self-pay | Admitting: Gastroenterology
